# Patient Record
Sex: Male | Born: 1985 | Race: Black or African American | Hispanic: No | Marital: Single | Smoking: Former smoker
Health system: Southern US, Community
[De-identification: ages and names within clinical notes are randomized; demographics above are authoritative.]

## PROBLEM LIST (undated history)

## (undated) DIAGNOSIS — J189 Pneumonia, unspecified organism: Secondary | ICD-10-CM

## (undated) DIAGNOSIS — K469 Unspecified abdominal hernia without obstruction or gangrene: Secondary | ICD-10-CM

## (undated) DIAGNOSIS — C801 Malignant (primary) neoplasm, unspecified: Secondary | ICD-10-CM

## (undated) DIAGNOSIS — N200 Calculus of kidney: Secondary | ICD-10-CM

## (undated) DIAGNOSIS — J45909 Unspecified asthma, uncomplicated: Secondary | ICD-10-CM

## (undated) DIAGNOSIS — N5089 Other specified disorders of the male genital organs: Secondary | ICD-10-CM

## (undated) HISTORY — PX: BRAIN SURGERY: SHX531

## (undated) HISTORY — PX: OTHER SURGICAL HISTORY: SHX169

## (undated) HISTORY — PX: HERNIA REPAIR: SHX51

---
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
## 2004-08-12 ENCOUNTER — Emergency Department (HOSPITAL_COMMUNITY): Admission: EM | Admit: 2004-08-12 | Discharge: 2004-08-13 | Payer: Self-pay | Admitting: Emergency Medicine

## 2005-12-05 ENCOUNTER — Emergency Department (HOSPITAL_COMMUNITY): Admission: EM | Admit: 2005-12-05 | Discharge: 2005-12-05 | Payer: Self-pay | Admitting: Family Medicine

## 2007-11-03 ENCOUNTER — Emergency Department (HOSPITAL_COMMUNITY): Admission: EM | Admit: 2007-11-03 | Discharge: 2007-11-03 | Payer: Self-pay | Admitting: Family Medicine

## 2007-11-05 ENCOUNTER — Emergency Department (HOSPITAL_COMMUNITY): Admission: EM | Admit: 2007-11-05 | Discharge: 2007-11-05 | Payer: Self-pay | Admitting: Family Medicine

## 2008-08-10 ENCOUNTER — Emergency Department (HOSPITAL_COMMUNITY): Admission: EM | Admit: 2008-08-10 | Discharge: 2008-08-10 | Payer: Self-pay | Admitting: Emergency Medicine

## 2009-12-23 ENCOUNTER — Emergency Department (HOSPITAL_COMMUNITY): Admission: EM | Admit: 2009-12-23 | Discharge: 2009-12-23 | Payer: Self-pay | Admitting: Family Medicine

## 2010-07-13 ENCOUNTER — Emergency Department (HOSPITAL_COMMUNITY): Payer: Self-pay

## 2010-07-13 ENCOUNTER — Emergency Department (HOSPITAL_COMMUNITY)
Admission: EM | Admit: 2010-07-13 | Discharge: 2010-07-13 | Disposition: A | Discharge status: Home / Self Care | Payer: Self-pay | Attending: Emergency Medicine | Admitting: Emergency Medicine

## 2010-07-13 DIAGNOSIS — N201 Calculus of ureter: Secondary | ICD-10-CM | POA: Insufficient documentation

## 2010-07-13 DIAGNOSIS — R109 Unspecified abdominal pain: Secondary | ICD-10-CM | POA: Insufficient documentation

## 2010-07-13 LAB — URINALYSIS, ROUTINE W REFLEX MICROSCOPIC
Bilirubin Urine: NEGATIVE
Glucose, UA: NEGATIVE mg/dL
Nitrite: NEGATIVE
Protein, ur: 30 mg/dL — AB
Specific Gravity, Urine: 1.025 (ref 1.005–1.030)
Urobilinogen, UA: 0.2 mg/dL (ref 0.0–1.0)
pH: 7.5 (ref 5.0–8.0)

## 2010-07-13 LAB — URINE MICROSCOPIC-ADD ON

## 2011-01-01 ENCOUNTER — Emergency Department (HOSPITAL_COMMUNITY): Payer: No Typology Code available for payment source

## 2011-01-01 ENCOUNTER — Emergency Department (HOSPITAL_COMMUNITY)
Admission: EM | Admit: 2011-01-01 | Discharge: 2011-01-01 | Disposition: A | Discharge status: Home / Self Care | Payer: No Typology Code available for payment source | Attending: Emergency Medicine | Admitting: Emergency Medicine

## 2011-01-01 DIAGNOSIS — M542 Cervicalgia: Secondary | ICD-10-CM | POA: Insufficient documentation

## 2011-01-01 DIAGNOSIS — M545 Low back pain, unspecified: Secondary | ICD-10-CM | POA: Insufficient documentation

## 2011-01-01 DIAGNOSIS — S139XXA Sprain of joints and ligaments of unspecified parts of neck, initial encounter: Secondary | ICD-10-CM | POA: Insufficient documentation

## 2011-01-01 DIAGNOSIS — S335XXA Sprain of ligaments of lumbar spine, initial encounter: Secondary | ICD-10-CM | POA: Insufficient documentation

## 2011-01-21 LAB — CULTURE, ROUTINE-ABSCESS

## 2012-03-26 DIAGNOSIS — J189 Pneumonia, unspecified organism: Secondary | ICD-10-CM

## 2012-03-26 HISTORY — DX: Pneumonia, unspecified organism: J18.9

## 2012-04-22 ENCOUNTER — Emergency Department (HOSPITAL_COMMUNITY)
Admission: EM | Admit: 2012-04-22 | Discharge: 2012-04-23 | Disposition: A | Discharge status: Home / Self Care | Payer: Self-pay | Attending: Emergency Medicine | Admitting: Emergency Medicine

## 2012-04-22 ENCOUNTER — Encounter (HOSPITAL_COMMUNITY): Payer: Self-pay | Admitting: Emergency Medicine

## 2012-04-22 ENCOUNTER — Emergency Department (HOSPITAL_COMMUNITY): Payer: Self-pay

## 2012-04-22 DIAGNOSIS — R059 Cough, unspecified: Secondary | ICD-10-CM | POA: Insufficient documentation

## 2012-04-22 DIAGNOSIS — R0602 Shortness of breath: Secondary | ICD-10-CM | POA: Insufficient documentation

## 2012-04-22 DIAGNOSIS — R52 Pain, unspecified: Secondary | ICD-10-CM | POA: Insufficient documentation

## 2012-04-22 DIAGNOSIS — R05 Cough: Secondary | ICD-10-CM | POA: Insufficient documentation

## 2012-04-22 DIAGNOSIS — J069 Acute upper respiratory infection, unspecified: Secondary | ICD-10-CM | POA: Insufficient documentation

## 2012-04-22 DIAGNOSIS — Z8719 Personal history of other diseases of the digestive system: Secondary | ICD-10-CM | POA: Insufficient documentation

## 2012-04-22 DIAGNOSIS — R51 Headache: Secondary | ICD-10-CM | POA: Insufficient documentation

## 2012-04-22 DIAGNOSIS — F17200 Nicotine dependence, unspecified, uncomplicated: Secondary | ICD-10-CM | POA: Insufficient documentation

## 2012-04-22 DIAGNOSIS — Z7982 Long term (current) use of aspirin: Secondary | ICD-10-CM | POA: Insufficient documentation

## 2012-04-22 HISTORY — DX: Unspecified asthma, uncomplicated: J45.909

## 2012-04-22 HISTORY — DX: Unspecified abdominal hernia without obstruction or gangrene: K46.9

## 2012-04-22 LAB — POCT I-STAT TROPONIN I: Troponin i, poc: 0 ng/mL (ref 0.00–0.08)

## 2012-04-22 LAB — CBC
HCT: 40.7 % (ref 39.0–52.0)
Hemoglobin: 14.7 g/dL (ref 13.0–17.0)
MCH: 32.2 pg (ref 26.0–34.0)
MCHC: 36.1 g/dL — ABNORMAL HIGH (ref 30.0–36.0)
MCV: 89.3 fL (ref 78.0–100.0)
Platelets: 142 10*3/uL — ABNORMAL LOW (ref 150–400)
RBC: 4.56 MIL/uL (ref 4.22–5.81)
RDW: 12 % (ref 11.5–15.5)
WBC: 9.2 10*3/uL (ref 4.0–10.5)

## 2012-04-22 LAB — BASIC METABOLIC PANEL
BUN: 6 mg/dL (ref 6–23)
CO2: 24 mEq/L (ref 19–32)
Chloride: 97 mEq/L (ref 96–112)
Creatinine, Ser: 1.1 mg/dL (ref 0.50–1.35)
GFR calc Af Amer: >90 mL/min (ref >90)
Glucose, Bld: 103 mg/dL — ABNORMAL HIGH (ref 70–99)
Potassium: 3.4 mEq/L — ABNORMAL LOW (ref 3.5–5.1)

## 2012-04-22 MED ORDER — KETOROLAC TROMETHAMINE 30 MG/ML IJ SOLN
30.0000 mg | Freq: Once | INTRAMUSCULAR | Status: AC
  Administered 2012-04-23: 30 mg via INTRAVENOUS
  Filled 2012-04-22: qty 1

## 2012-04-22 MED ORDER — IBUPROFEN 800 MG PO TABS
ORAL_TABLET | ORAL | Status: AC
  Administered 2012-04-22: 22:00:00
  Filled 2012-04-22: qty 1

## 2012-04-22 MED ORDER — ALBUTEROL SULFATE (5 MG/ML) 0.5% IN NEBU
5.0000 mg | INHALATION_SOLUTION | Freq: Once | RESPIRATORY_TRACT | Status: AC
  Administered 2012-04-22: 5 mg via RESPIRATORY_TRACT
  Filled 2012-04-22: qty 1

## 2012-04-22 MED ORDER — SODIUM CHLORIDE 0.9 % IV BOLUS (SEPSIS)
1000.0000 mL | Freq: Once | INTRAVENOUS | Status: AC
  Administered 2012-04-23: 1000 mL via INTRAVENOUS

## 2012-04-22 MED ORDER — IPRATROPIUM-ALBUTEROL 20-100 MCG/ACT IN AERS
2.0000 | INHALATION_SPRAY | RESPIRATORY_TRACT | Status: DC
  Administered 2012-04-23: 2 via RESPIRATORY_TRACT
  Filled 2012-04-22: qty 4

## 2012-04-22 NOTE — ED Provider Notes (Signed)
History     CSN: [id number]  Arrival date & time 04/22/12  2040   First MD Initiated Contact with Patient 04/22/12 2329      Chief Complaint  Patient presents with  . Fever  . Shortness of Breath  . Headache  . Generalized Body Aches    (Consider location/radiation/quality/duration/timing/severity/associated sxs/prior treatment) HPI History provided by pt.   Pt has had a cough productive of yellow sputum for the past 2 days.  Associated w/ fever, chills, body aches, generalized weakness, constant SOB, frontal headache, nasal congestion, rhinorrhea.  Sx worsened yesterday evening.  Denies CP, abd pain, N/V/D, urinary sx.  Has been taking motrin w/out relief.  No known sick contacts.  Was evaluated at [facility] this morning, diagnosed w/ viral URI and d/c'd home.  Past Medical History  Diagnosis Date  . Hernia     Past Surgical History  Procedure Date  . Hernia repair     History reviewed. No pertinent family history.  History  Substance Use Topics  . Smoking status: Current Every Day Smoker  . Smokeless tobacco: Not on file  . Alcohol Use: Yes     Comment: occ      Review of Systems  All other systems reviewed and are negative.    Allergies  Review of patient's allergies indicates no known allergies.  Home Medications   Current Outpatient Rx  Name  Route  Sig  Dispense  Refill  . ACETAMINOPHEN 500 MG PO TABS   Oral   Take 1,000 mg by mouth every 6 (six) hours as needed. Pain/fever         . ASPIRIN EFFERVESCENT 325 MG PO TBEF   Oral   Take 650 mg by mouth every 6 (six) hours as needed. Pain/fever           BP 128/54  Pulse 96  Temp 102.3 F (39.1 C) (Oral)  Resp 16  SpO2 93%  Physical Exam  Nursing note and vitals reviewed. Constitutional: He is oriented to person, place, and time. He appears well-developed and well-nourished. No distress.  HENT:  Head: Normocephalic and atraumatic.       Mildly erythematous posterior pharynx.   Chapped lips  Eyes:       Normal appearance  Neck: Normal range of motion.  Cardiovascular: Normal rate and regular rhythm.   Pulmonary/Chest: Effort normal and breath sounds normal. No respiratory distress.  Abdominal: Soft. Bowel sounds are normal. He exhibits no distension.       Mild RLQ tenderness that pt attributes to hernia  Musculoskeletal: Normal range of motion.  Lymphadenopathy:    He has no cervical adenopathy.  Neurological: He is alert and oriented to person, place, and time.       CN 3-12 intact.  No sensory deficits.  5/5 and equal upper and lower extremity strength.  No past pointing.   Skin: Skin is warm and dry. No rash noted.  Psychiatric: He has a normal mood and affect. His behavior is normal.    ED Course  Procedures (including critical care time)  Labs Reviewed  BASIC METABOLIC PANEL - Abnormal; Notable for the following:    Sodium 130 (*)     Potassium 3.4 (*)     Glucose, Bld 103 (*)     All other components within normal limits  CBC - Abnormal; Notable for the following:    MCHC 36.1 (*)     Platelets 142 (*)     All other  components within normal limits  POCT I-STAT TROPONIN I   Dg Chest 2 View  04/22/2012  *RADIOLOGY REPORT*  Clinical Data: Shortness of breath, fever and body aches. Headache.  History of smoking and asthma.  CHEST - 2 VIEW  Comparison: Chest radiograph performed 01/01/2011  Findings: The lungs are well-aerated.  There may be minimal left basilar airspace opacity, which could reflect mild pneumonia. There is no evidence of pleural effusion or pneumothorax.  The heart is normal in size; the mediastinal contour is within normal limits.  No acute osseous abnormalities are seen.  IMPRESSION: Suggestion of mild left basilar airspace opacity, which could reflect mild pneumonia.   Original Report Authenticated By: Tonia Ghent, M.D.      1. Viral URI with cough       MDM  Healthy 26yo M presents w/ sx most consistent w/ viral URI.   Febrile, no respiratory distress, mildly dehydrated, no focal neuro deficts on exam. CXR shows possible CAP.  Pt received 1L bolus NS + toradol and motrin.  VS and sx improved.  D/c'd home w/ zpak and albuterol inhaler and recommended rest and fluids.  Return precautions discussed.         Otilio Miu, PA-C 04/23/12 0120

## 2012-04-22 NOTE — ED Notes (Signed)
Patient reports that he has had general body aches and fever for 2 -3 days. The patient reports that he is also having a headache, and shortness of breath. Patient also reports that he is having dizziness and lightheadedness. The patient was seen at [facility] this am

## 2012-04-23 ENCOUNTER — Encounter (HOSPITAL_COMMUNITY): Payer: Self-pay | Admitting: Family Medicine

## 2012-04-23 MED ORDER — AZITHROMYCIN 250 MG PO TABS: ORAL_TABLET | ORAL | Status: DC

## 2012-04-25 NOTE — ED Provider Notes (Signed)
Medical screening examination/treatment/procedure(s) were performed by non-physician practitioner and as supervising physician I was immediately available for consultation/collaboration.   Flint Melter, MD 04/25/12 [phone]

## 2012-05-30 ENCOUNTER — Encounter (HOSPITAL_BASED_OUTPATIENT_CLINIC_OR_DEPARTMENT_OTHER): Payer: Self-pay | Admitting: *Deleted

## 2012-05-30 ENCOUNTER — Emergency Department (HOSPITAL_BASED_OUTPATIENT_CLINIC_OR_DEPARTMENT_OTHER)
Admission: EM | Admit: 2012-05-30 | Discharge: 2012-05-30 | Disposition: A | Discharge status: Home / Self Care | Payer: Self-pay | Attending: Emergency Medicine | Admitting: Emergency Medicine

## 2012-05-30 ENCOUNTER — Emergency Department (HOSPITAL_BASED_OUTPATIENT_CLINIC_OR_DEPARTMENT_OTHER): Payer: Self-pay

## 2012-05-30 DIAGNOSIS — F1210 Cannabis abuse, uncomplicated: Secondary | ICD-10-CM | POA: Insufficient documentation

## 2012-05-30 DIAGNOSIS — R599 Enlarged lymph nodes, unspecified: Secondary | ICD-10-CM | POA: Insufficient documentation

## 2012-05-30 DIAGNOSIS — Z8619 Personal history of other infectious and parasitic diseases: Secondary | ICD-10-CM | POA: Insufficient documentation

## 2012-05-30 DIAGNOSIS — J45909 Unspecified asthma, uncomplicated: Secondary | ICD-10-CM | POA: Insufficient documentation

## 2012-05-30 DIAGNOSIS — M542 Cervicalgia: Secondary | ICD-10-CM | POA: Insufficient documentation

## 2012-05-30 DIAGNOSIS — Z8719 Personal history of other diseases of the digestive system: Secondary | ICD-10-CM | POA: Insufficient documentation

## 2012-05-30 DIAGNOSIS — Z8701 Personal history of pneumonia (recurrent): Secondary | ICD-10-CM | POA: Insufficient documentation

## 2012-05-30 DIAGNOSIS — H9209 Otalgia, unspecified ear: Secondary | ICD-10-CM | POA: Insufficient documentation

## 2012-05-30 DIAGNOSIS — Z87442 Personal history of urinary calculi: Secondary | ICD-10-CM | POA: Insufficient documentation

## 2012-05-30 DIAGNOSIS — R509 Fever, unspecified: Secondary | ICD-10-CM | POA: Insufficient documentation

## 2012-05-30 DIAGNOSIS — R5381 Other malaise: Secondary | ICD-10-CM | POA: Insufficient documentation

## 2012-05-30 DIAGNOSIS — J039 Acute tonsillitis, unspecified: Secondary | ICD-10-CM

## 2012-05-30 DIAGNOSIS — R52 Pain, unspecified: Secondary | ICD-10-CM | POA: Insufficient documentation

## 2012-05-30 DIAGNOSIS — F17200 Nicotine dependence, unspecified, uncomplicated: Secondary | ICD-10-CM | POA: Insufficient documentation

## 2012-05-30 HISTORY — DX: Pneumonia, unspecified organism: J18.9

## 2012-05-30 HISTORY — DX: Calculus of kidney: N20.0

## 2012-05-30 LAB — CBC WITH DIFFERENTIAL/PLATELET
Eosinophils Relative: 0 % (ref 0–5)
Hemoglobin: 15.8 g/dL (ref 13.0–17.0)
Lymphocytes Relative: 13 % (ref 12–46)
Lymphs Abs: 1.4 10*3/uL (ref 0.7–4.0)
MCV: 89.9 fL (ref 78.0–100.0)
Monocytes Relative: 13 % — ABNORMAL HIGH (ref 3–12)
Platelets: 167 10*3/uL (ref 150–400)
RBC: 4.86 MIL/uL (ref 4.22–5.81)
WBC: 10.7 10*3/uL — ABNORMAL HIGH (ref 4.0–10.5)

## 2012-05-30 MED ORDER — HYDROCODONE-ACETAMINOPHEN 5-325 MG PO TABS: 2.0000 | ORAL_TABLET | ORAL | Status: DC | PRN

## 2012-05-30 MED ORDER — CLINDAMYCIN PHOSPHATE 600 MG/50ML IV SOLN
600.0000 mg | Freq: Once | INTRAVENOUS | Status: AC
  Administered 2012-05-30: 600 mg via INTRAVENOUS
  Filled 2012-05-30: qty 50

## 2012-05-30 MED ORDER — DEXTROSE 5 % IV SOLN
1.0000 g | INTRAVENOUS | Status: DC
  Administered 2012-05-30: 1 g via INTRAVENOUS
  Filled 2012-05-30: qty 10

## 2012-05-30 MED ORDER — IBUPROFEN 800 MG PO TABS
800.0000 mg | ORAL_TABLET | Freq: Once | ORAL | Status: AC
  Administered 2012-05-30: 800 mg via ORAL
  Filled 2012-05-30: qty 1

## 2012-05-30 MED ORDER — CLINDAMYCIN HCL 150 MG PO CAPS: 150.0000 mg | ORAL_CAPSULE | Freq: Four times a day (QID) | ORAL | Status: DC

## 2012-05-30 NOTE — ED Notes (Signed)
Patient states he has had generalized body aches, headache, sore throat w/ swallowing problems and ear aches, chills, sweats and fever.

## 2012-05-30 NOTE — ED Provider Notes (Signed)
History     CSN: [id number]  Arrival date & time 05/30/12  1528   First MD Initiated Contact with Patient 05/30/12 1558      Chief Complaint  Patient presents with  . Generalized Body Aches  . Sore Throat    (Consider location/radiation/quality/duration/timing/severity/associated sxs/prior treatment) Patient is a 27 y.o. male presenting with pharyngitis. The history is provided by the patient. No language interpreter was used.  Sore Throat This is a new problem. The current episode started yesterday. The problem occurs constantly. The problem has been gradually worsening. Associated symptoms include fatigue, a fever, a sore throat and swollen glands. Nothing aggravates the symptoms. He has tried nothing for the symptoms. The treatment provided no relief.   Pt complains of a bad sore throat.   Pt has pain into his ears and his neck.   Pt reports he had influenza and pneumonia last month.  Pt complains of fevers and sweating Past Medical History  Diagnosis Date  . Hernia   . Asthma   . Flu 12/14  . Pneumonia 12/13  . Kidney stones     Past Surgical History  Procedure Date  . Hernia repair     No family history on file.  History  Substance Use Topics  . Smoking status: Current Every Day Smoker  . Smokeless tobacco: Not on file  . Alcohol Use: Yes     Comment: occ      Review of Systems  Constitutional: Positive for fever and fatigue.  HENT: Positive for sore throat.   All other systems reviewed and are negative.    Allergies  Review of patient's allergies indicates no known allergies.  Home Medications   Current Outpatient Rx  Name  Route  Sig  Dispense  Refill  . IBUPROFEN 800 MG PO TABS   Oral   Take 800 mg by mouth every 8 (eight) hours as needed.         Marland Kitchen PHENOL 1.4 % MT LIQD   Mouth/Throat   Use as directed 1 spray in the mouth or throat as needed.         . ACETAMINOPHEN 500 MG PO TABS   Oral   Take 1,000 mg by mouth every 6 (six) hours as  needed. Pain/fever         . ASPIRIN EFFERVESCENT 325 MG PO TBEF   Oral   Take 650 mg by mouth every 6 (six) hours as needed. Pain/fever         . AZITHROMYCIN 250 MG PO TABS      2po day 1 and then 1po days 2-5   6 each   0     There were no vitals taken for this visit.  Physical Exam  Nursing note and vitals reviewed. Constitutional: He appears well-developed and well-nourished.  HENT:  Head: Normocephalic and atraumatic.  Mouth/Throat: Oropharyngeal exudate present.       Exudative, erythematous edematous tonsils,   Uvula midline, no palate edema  Eyes: Conjunctivae normal and EOM are normal. Pupils are equal, round, and reactive to light.  Neck: Normal range of motion. Neck supple.  Cardiovascular: Normal rate.   Pulmonary/Chest: Effort normal.  Abdominal: Soft.  Musculoskeletal: Normal range of motion.  Neurological: He is alert.  Skin: Skin is warm.    ED Course  Procedures (including critical care time)   Labs Reviewed  RAPID STREP SCREEN   No results found.   No diagnosis found.    MDM  Strep and  mono are negative.    Pt given clindamycin and rocephin IV.    Pt to return here tomorrow at 12 noon for recheck.          Lonia Skinner [place], [place] 05/30/12 [phone]

## 2012-05-30 NOTE — ED Provider Notes (Addendum)
Medical screening examination/treatment/procedure(s) were performed by non-physician practitioner and as supervising physician I was immediately available for consultation/collaboration.    Celene Kras, MD 05/30/12 8657  Celene Kras, MD 05/30/12 [phone]

## 2012-12-11 ENCOUNTER — Emergency Department (HOSPITAL_BASED_OUTPATIENT_CLINIC_OR_DEPARTMENT_OTHER): Payer: Self-pay

## 2012-12-11 ENCOUNTER — Encounter (HOSPITAL_BASED_OUTPATIENT_CLINIC_OR_DEPARTMENT_OTHER): Payer: Self-pay | Admitting: *Deleted

## 2012-12-11 ENCOUNTER — Emergency Department (HOSPITAL_BASED_OUTPATIENT_CLINIC_OR_DEPARTMENT_OTHER)
Admission: EM | Admit: 2012-12-11 | Discharge: 2012-12-11 | Disposition: A | Discharge status: Home / Self Care | Payer: Self-pay | Attending: Emergency Medicine | Admitting: Emergency Medicine

## 2012-12-11 DIAGNOSIS — S335XXA Sprain of ligaments of lumbar spine, initial encounter: Secondary | ICD-10-CM | POA: Insufficient documentation

## 2012-12-11 DIAGNOSIS — R059 Cough, unspecified: Secondary | ICD-10-CM | POA: Insufficient documentation

## 2012-12-11 DIAGNOSIS — S39012A Strain of muscle, fascia and tendon of lower back, initial encounter: Secondary | ICD-10-CM

## 2012-12-11 DIAGNOSIS — Y92410 Unspecified street and highway as the place of occurrence of the external cause: Secondary | ICD-10-CM | POA: Insufficient documentation

## 2012-12-11 DIAGNOSIS — J3489 Other specified disorders of nose and nasal sinuses: Secondary | ICD-10-CM | POA: Insufficient documentation

## 2012-12-11 DIAGNOSIS — R05 Cough: Secondary | ICD-10-CM | POA: Insufficient documentation

## 2012-12-11 DIAGNOSIS — Z87442 Personal history of urinary calculi: Secondary | ICD-10-CM | POA: Insufficient documentation

## 2012-12-11 DIAGNOSIS — Z8719 Personal history of other diseases of the digestive system: Secondary | ICD-10-CM | POA: Insufficient documentation

## 2012-12-11 DIAGNOSIS — J40 Bronchitis, not specified as acute or chronic: Secondary | ICD-10-CM

## 2012-12-11 DIAGNOSIS — J45901 Unspecified asthma with (acute) exacerbation: Secondary | ICD-10-CM | POA: Insufficient documentation

## 2012-12-11 DIAGNOSIS — F17200 Nicotine dependence, unspecified, uncomplicated: Secondary | ICD-10-CM | POA: Insufficient documentation

## 2012-12-11 DIAGNOSIS — Z8701 Personal history of pneumonia (recurrent): Secondary | ICD-10-CM | POA: Insufficient documentation

## 2012-12-11 DIAGNOSIS — Y939 Activity, unspecified: Secondary | ICD-10-CM | POA: Insufficient documentation

## 2012-12-11 MED ORDER — ALBUTEROL SULFATE HFA 108 (90 BASE) MCG/ACT IN AERS
2.0000 | INHALATION_SPRAY | RESPIRATORY_TRACT | Status: DC | PRN
  Administered 2012-12-11: 2 via RESPIRATORY_TRACT
  Filled 2012-12-11: qty 6.7

## 2012-12-11 MED ORDER — HYDROCODONE-ACETAMINOPHEN 5-325 MG PO TABS
ORAL_TABLET | ORAL | Status: AC
  Filled 2012-12-11: qty 1

## 2012-12-11 MED ORDER — PREDNISONE 20 MG PO TABS: 40.0000 mg | ORAL_TABLET | Freq: Every day | ORAL | Status: DC

## 2012-12-11 MED ORDER — IPRATROPIUM BROMIDE 0.02 % IN SOLN
0.5000 mg | Freq: Once | RESPIRATORY_TRACT | Status: AC
  Administered 2012-12-11: 0.5 mg via RESPIRATORY_TRACT
  Filled 2012-12-11: qty 2.5

## 2012-12-11 MED ORDER — AZITHROMYCIN 250 MG PO TABS: 250.0000 mg | ORAL_TABLET | Freq: Every day | ORAL | Status: DC

## 2012-12-11 MED ORDER — PREDNISONE 50 MG PO TABS
60.0000 mg | ORAL_TABLET | Freq: Once | ORAL | Status: AC
  Administered 2012-12-11: 60 mg via ORAL
  Filled 2012-12-11: qty 1

## 2012-12-11 MED ORDER — HYDROCODONE-ACETAMINOPHEN 5-325 MG PO TABS
1.0000 | ORAL_TABLET | Freq: Once | ORAL | Status: AC
  Administered 2012-12-11: 1 via ORAL

## 2012-12-11 MED ORDER — ALBUTEROL SULFATE (5 MG/ML) 0.5% IN NEBU
5.0000 mg | INHALATION_SOLUTION | Freq: Once | RESPIRATORY_TRACT | Status: AC
  Administered 2012-12-11: 5 mg via RESPIRATORY_TRACT
  Filled 2012-12-11: qty 1

## 2012-12-11 MED ORDER — HYDROCODONE-ACETAMINOPHEN 5-325 MG PO TABS: 1.0000 | ORAL_TABLET | ORAL | Status: DC | PRN

## 2012-12-11 NOTE — Progress Notes (Signed)
Verified Patients identity by patient stating his full name, birthday, and age.
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
## 2012-12-12 NOTE — ED Provider Notes (Signed)
Medical screening examination/treatment/procedure(s) were performed by non-physician practitioner and as supervising physician I was immediately available for consultation/collaboration.   Shanna Cisco, MD 12/12/12 [phone]

## 2012-12-25 ENCOUNTER — Emergency Department (HOSPITAL_BASED_OUTPATIENT_CLINIC_OR_DEPARTMENT_OTHER)
Admission: EM | Admit: 2012-12-25 | Discharge: 2012-12-25 | Disposition: A | Discharge status: Home / Self Care | Payer: Self-pay | Attending: Emergency Medicine | Admitting: Emergency Medicine

## 2012-12-25 ENCOUNTER — Emergency Department (HOSPITAL_BASED_OUTPATIENT_CLINIC_OR_DEPARTMENT_OTHER): Payer: Self-pay

## 2012-12-25 ENCOUNTER — Encounter (HOSPITAL_BASED_OUTPATIENT_CLINIC_OR_DEPARTMENT_OTHER): Payer: Self-pay | Admitting: *Deleted

## 2012-12-25 DIAGNOSIS — K612 Anorectal abscess: Secondary | ICD-10-CM

## 2012-12-25 DIAGNOSIS — F17200 Nicotine dependence, unspecified, uncomplicated: Secondary | ICD-10-CM | POA: Insufficient documentation

## 2012-12-25 DIAGNOSIS — K610 Anal abscess: Secondary | ICD-10-CM

## 2012-12-25 MED ORDER — ONDANSETRON HCL 4 MG/2ML IJ SOLN
4.0000 mg | Freq: Once | INTRAMUSCULAR | Status: AC
  Administered 2012-12-25: 4 mg via INTRAVENOUS
  Filled 2012-12-25: qty 2

## 2012-12-25 MED ORDER — LIDOCAINE 4 % EX CREA
TOPICAL_CREAM | Freq: Once | CUTANEOUS | Status: DC
  Filled 2012-12-25: qty 5

## 2012-12-25 MED ORDER — LIDOCAINE 5 % EX OINT
TOPICAL_OINTMENT | Freq: Every day | CUTANEOUS | Status: AC | PRN
  Administered 2012-12-25: 23:00:00 via TOPICAL
  Filled 2012-12-25: qty 35.44

## 2012-12-25 MED ORDER — HYDROMORPHONE HCL PF 1 MG/ML IJ SOLN
1.0000 mg | Freq: Once | INTRAMUSCULAR | Status: AC
  Administered 2012-12-25: 1 mg via INTRAVENOUS
  Filled 2012-12-25: qty 1

## 2012-12-25 MED ORDER — IOHEXOL 300 MG/ML  SOLN
50.0000 mL | Freq: Once | INTRAMUSCULAR | Status: AC | PRN
  Administered 2012-12-25: 50 mL via ORAL

## 2012-12-25 MED ORDER — HYDROMORPHONE HCL PF 1 MG/ML IJ SOLN
INTRAMUSCULAR | Status: AC
  Filled 2012-12-25: qty 1

## 2012-12-25 MED ORDER — HYDROMORPHONE HCL PF 1 MG/ML IJ SOLN
1.0000 mg | Freq: Once | INTRAMUSCULAR | Status: AC
  Administered 2012-12-25: 1 mg via INTRAVENOUS

## 2012-12-25 MED ORDER — IOHEXOL 300 MG/ML  SOLN
100.0000 mL | Freq: Once | INTRAMUSCULAR | Status: AC | PRN
  Administered 2012-12-25: 100 mL via INTRAVENOUS

## 2012-12-25 MED ORDER — LIDOCAINE-EPINEPHRINE 2 %-1:100000 IJ SOLN: 20.0000 mL | Freq: Once | INTRAMUSCULAR | Status: DC

## 2012-12-25 MED ORDER — HYDROMORPHONE HCL PF 1 MG/ML IJ SOLN: 1.0000 mg | Freq: Once | INTRAMUSCULAR | Status: DC

## 2012-12-25 NOTE — ED Notes (Signed)
Explained to Pt. And family that he will be transported by [facility].  Pt. Noted with attitude discomfort due to having to wait on transport service and because he is hungry.

## 2012-12-25 NOTE — ED Notes (Signed)
Transported to [facility] by [facility]

## 2012-12-25 NOTE — ED Notes (Signed)
abscess x 2 to his buttocks. Hx of same a year ago.

## 2012-12-25 NOTE — ED Provider Notes (Signed)
Pt seen and examined - has perirectal abscess - CT confirm 3cmin size - close to sphincter - d/w Gen Surgery who will see in ED to drain - Dr. Maisie Fus accepted.  D/w Dr. Silverio Lay - accepted to ED  Vida Roller, MD 12/25/12 [phone]

## 2012-12-25 NOTE — ED Provider Notes (Signed)
Medical screening examination/treatment/procedure(s) were performed by non-physician practitioner and as supervising physician I was immediately available for consultation/collaboration.   Nusrat Encarnacion E Juelz Claar, MD 12/25/12 1656 

## 2012-12-25 NOTE — ED Provider Notes (Signed)
Medical screening examination/treatment/procedure(s) were conducted as a shared visit with non-physician practitioner(s) and myself.  I personally evaluated the patient during the encounter  Please see my separate respective documentation pertaining to this patient encounter   Vida Roller, MD 12/25/12 2245

## 2012-12-25 NOTE — ED Provider Notes (Signed)
Patient to the ED transferred from [facility] to drain a perirectal abscess.  Dr. Maisie Fus has informed me that she has completed draining the rectal abscess and that the patient can go home with instructions to use a sitz bath 6 times a day. Does not need antibiotics and is ready for discharge.  Dr. Wilkie Aye is aware that the patient is being put up for discharge.  Dx: Perirectal abscess  27 y.o.Airen A Daise's evaluation in the Emergency Department is complete. It has been determined that no acute conditions requiring further emergency intervention are present at this time. The patient/guardian have been advised of the diagnosis and plan. We have discussed signs and symptoms that warrant return to the ED, such as changes or worsening in symptoms.  Vital signs are stable at discharge. Filed Vitals:   12/25/12 2119  BP: 123/71  Pulse: 73  Temp:   Resp: 18    Patient/guardian has voiced understanding and agreed to follow-up with the PCP or specialist.   Dorthula Matas, PA-C 12/25/12 2341

## 2012-12-25 NOTE — Consult Note (Signed)
Reason for Consult:Abscess, perirectal Referring Physician: Dr Monia Sabal is an 27 y.o. male.  HPI: He has been having perirectal pain for approximately 3-4 days. This has been getting worse. He denies any fevers. He denies any drainage or bleeding. He has had abscess like this before approximately 2 years ago and approximately the same spot. He denies any history of Crohn's disease within his family.  Past Medical History  Diagnosis Date  . Hernia   . Asthma   . Flu 12/14  . Pneumonia 12/13  . Kidney stones     Past Surgical History  Procedure Laterality Date  . Hernia repair      No family history on file.  Social History:  reports that he has been smoking Cigarettes.  He has been smoking about 0.50 packs per day. He does not have any smokeless tobacco history on file. He reports that  drinks alcohol. He reports that he uses illicit drugs (Marijuana).  Allergies: No Known Allergies  Medications: I have reviewed the patient's current medications.  No results found for this or any previous visit (from the past 48 hour(s)).  Ct Pelvis W Contrast  12/25/2012   CLINICAL DATA:  Rectal abscess. Severe pain.  EXAM: CT PELVIS WITH CONTRAST  TECHNIQUE: Multidetector CT imaging of the pelvis was performed using the standard protocol following the bolus administration of intravenous contrast.  CONTRAST:  50mL OMNIPAQUE IOHEXOL 300 MG/ML SOLN, OMNIPAQUE IOHEXOL 300 MG/ML SOLN  COMPARISON:  07/12/2012  FINDINGS: There is a 2.9 cm fluid collection just to the left of midline near the lower rectum or anus compatible with small abscess. No other fluid collection. Visualized large and small bowel are otherwise unremarkable. Urinary bladder and prostate grossly unremarkable. No free fluid, free air or adenopathy. No acute bony abnormality.  IMPRESSION: 2.9 cm left perirectal or perianal abscess.   Electronically Signed   By: Charlett Nose   On: 12/25/2012 17:41    Review of  Systems  Constitutional: Negative for fever and chills.  Respiratory: Negative for cough.   Cardiovascular: Negative for chest pain.  Gastrointestinal: Negative for nausea, vomiting and abdominal pain.  Genitourinary: Negative for dysuria.  Skin: Negative for rash.  Neurological: Negative for dizziness and headaches.   Blood pressure 123/71, pulse 73, temperature 98.2 F (36.8 C), temperature source Oral, resp. rate 18, height 6\' 2"  (1.88 m), weight 220 lb (99.791 kg), SpO2 98.00%. Physical Exam  Constitutional: He is oriented to person, place, and time. He appears well-developed and well-nourished.  HENT:  Head: Normocephalic and atraumatic.  Eyes: Conjunctivae are normal. Pupils are equal, round, and reactive to light.  Neck: Normal range of motion. Neck supple.  Cardiovascular: Normal rate and regular rhythm.   Respiratory: Effort normal and breath sounds normal. No respiratory distress.  GI: Soft. Bowel sounds are normal.  L anterior abscess, perirectal  Musculoskeletal: Normal range of motion.  Neurological: He is alert and oriented to person, place, and time.  Skin: Skin is warm and dry.    Assessment/Plan: The patient has a perirectal abscess. This can be debrided at bedside. We discussed the risk and benefits of this procedure. Consent was verbally obtained.  Given that this is a recurrent abscess I have asked him to see me back in the office in 3 to four-weeks.  I have asked him to do sitz baths at home 3-4 times a day. He should take a stool softener twice daily. We have given him some lidocaine  cream to use for local pain relief.  Patient stable for discharge.  Procedure: I&D perirectal abscess Surgeon: Maisie Fus Anesthesia: Local 2% lidocaine with epinephrine Indication: Perirectal abscess, confirmed by CT scan Description: The patient was identified as well as the site of the abscess. The area around the abscess was infused with lidocaine with epinephrine after being  swabbed with Betadine. An incision was made with an 11 blade scalpel in a cruciate manner. A large amount of purulence was extruded. A sterile dressing was then applied. The patient tolerated procedure well.  Mona Ayars C. 12/25/2012, 11:44 PM

## 2012-12-25 NOTE — ED Notes (Signed)
Patient repeatedly asking for food and drink. Patient and family at bedside has been informed that patient can not have anything to eat or drink until cleared by the surgeon. Rationale explained that if patient was to have to go to surgery tonight having food or drink in his system increases his risks of complication. Patient continues to ask new staff members as they enter room for food or beverage.

## 2012-12-25 NOTE — ED Notes (Signed)
Bed: WA01 Expected date:  Expected time:  Means of arrival:  Comments: Hold for transfer from [facility]

## 2012-12-25 NOTE — ED Notes (Signed)
Checking orders and vitals

## 2012-12-25 NOTE — ED Provider Notes (Signed)
CSN: [id number]     Arrival date & time 12/25/12  1355 History   First MD Initiated Contact with Patient 12/25/12 1412     Chief Complaint  Patient presents with  . Abscess   (Consider location/radiation/quality/duration/timing/severity/associated sxs/prior Treatment) HPI Comments: Pt states that he has similar history and he was supposed to have surgery,but it was never scheduled  Patient is a 27 y.o. male presenting with abscess. The history is provided by the patient. No language interpreter was used.  Abscess Location:  Ano-genital Ano-genital abscess location:  Anus Abscess quality: fluctuance and painful   Abscess quality: no redness   Red streaking: no   Duration:  2 days Progression:  Worsening Pain details:    Quality:  Throbbing   Severity:  Severe   Past Medical History  Diagnosis Date  . Hernia   . Asthma   . Flu 12/14  . Pneumonia 12/13  . Kidney stones    Past Surgical History  Procedure Laterality Date  . Hernia repair     No family history on file. History  Substance Use Topics  . Smoking status: Current Every Day Smoker -- 0.50 packs/day    Types: Cigarettes  . Smokeless tobacco: Not on file  . Alcohol Use: Yes     Comment: occ    Review of Systems  Constitutional: Negative.   Respiratory: Negative.   Cardiovascular: Negative.     Allergies  Review of patient's allergies indicates no known allergies.  Home Medications   Current Outpatient Rx  Name  Route  Sig  Dispense  Refill  . azithromycin (ZITHROMAX) 250 MG tablet   Oral   Take 1 tablet (250 mg total) by mouth daily. Take first 2 tablets together, then 1 every day until finished.   6 tablet   0   . HYDROcodone-acetaminophen (NORCO/VICODIN) 5-325 MG per tablet   Oral   Take 1 tablet by mouth every 4 (four) hours as needed for pain.   10 tablet   0   . predniSONE (DELTASONE) 20 MG tablet   Oral   Take 2 tablets (40 mg total) by mouth daily.   10 tablet   0    BP 124/79   Pulse 90  Temp(Src) 98.2 F (36.8 C) (Oral)  Resp 20  Ht 6\' 2"  (1.88 m)  Wt 220 lb (99.791 kg)  BMI 28.23 kg/m2  SpO2 99% Physical Exam  Nursing note and vitals reviewed. Constitutional: He is oriented to person, place, and time. He appears well-developed and well-nourished.  Cardiovascular: Normal rate and regular rhythm.   Pulmonary/Chest: Effort normal and breath sounds normal.  Abdominal: Soft. Bowel sounds are normal. There is no tenderness.  Genitourinary:  Fluctuance noted to the rectal area:unable to access whether it tracts in the bottom as pt not tolerating exam well  Musculoskeletal: Normal range of motion.  Neurological: He is alert and oriented to person, place, and time.  Skin: Skin is warm and dry.    ED Course  Procedures (including critical care time) Labs Review Labs Reviewed - No data to display Imaging Review No results found.  MDM  No diagnosis found. Pt is waiting on ct:pt is being left with [facility]    Teressa Lower, NP 12/25/12 1625

## 2012-12-25 NOTE — ED Notes (Signed)
Dr Thomas at bedside

## 2012-12-25 NOTE — ED Provider Notes (Signed)
CSN: [id number]     Arrival date & time 12/25/12  1355 History   First MD Initiated Contact with Patient 12/25/12 1412     Chief Complaint  Patient presents with  . Abscess   (Consider location/radiation/quality/duration/timing/severity/associated sxs/prior Treatment) HPI  Past Medical History  Diagnosis Date  . Hernia   . Asthma   . Flu 12/14  . Pneumonia 12/13  . Kidney stones    Past Surgical History  Procedure Laterality Date  . Hernia repair     No family history on file. History  Substance Use Topics  . Smoking status: Current Every Day Smoker -- 0.50 packs/day    Types: Cigarettes  . Smokeless tobacco: Not on file  . Alcohol Use: Yes     Comment: occ    Review of Systems  Allergies  Review of patient's allergies indicates no known allergies.  Home Medications   Current Outpatient Rx  Name  Route  Sig  Dispense  Refill  . azithromycin (ZITHROMAX) 250 MG tablet   Oral   Take 1 tablet (250 mg total) by mouth daily. Take first 2 tablets together, then 1 every day until finished.   6 tablet   0   . HYDROcodone-acetaminophen (NORCO/VICODIN) 5-325 MG per tablet   Oral   Take 1 tablet by mouth every 4 (four) hours as needed for pain.   10 tablet   0   . predniSONE (DELTASONE) 20 MG tablet   Oral   Take 2 tablets (40 mg total) by mouth daily.   10 tablet   0    BP 133/68  Pulse 84  Temp(Src) 98.2 F (36.8 C) (Oral)  Resp 18  Ht 6\' 2"  (1.88 m)  Wt 220 lb (99.791 kg)  BMI 28.23 kg/m2  SpO2 99% Physical Exam  ED Course  Procedures (including critical care time) Labs Review Labs Reviewed - No data to display Imaging Review Ct Pelvis W Contrast  12/25/2012   CLINICAL DATA:  Rectal abscess. Severe pain.  EXAM: CT PELVIS WITH CONTRAST  TECHNIQUE: Multidetector CT imaging of the pelvis was performed using the standard protocol following the bolus administration of intravenous contrast.  CONTRAST:  50mL OMNIPAQUE IOHEXOL 300 MG/ML SOLN, OMNIPAQUE  IOHEXOL 300 MG/ML SOLN  COMPARISON:  07/12/2012  FINDINGS: There is a 2.9 cm fluid collection just to the left of midline near the lower rectum or anus compatible with small abscess. No other fluid collection. Visualized large and small bowel are otherwise unremarkable. Urinary bladder and prostate grossly unremarkable. No free fluid, free air or adenopathy. No acute bony abnormality.  IMPRESSION: 2.9 cm left perirectal or perianal abscess.   Electronically Signed   By: Charlett Nose   On: 12/25/2012 17:41    MDM   1. Perianal abscess   Patient care taken over from Lonna Cobb FNP - patient with perianal vs perirectal abscess - has been accepted in transfer to [facility] to the care of Dr. Maisie Fus - patient medicatied     Izola Price. Marisue Humble, [place] 12/25/12 1936

## 2012-12-25 NOTE — ED Notes (Signed)
Spoke to Dr Maisie Fus, requested lidocaine creme to be applied to patient abcsess site.

## 2012-12-25 NOTE — ED Notes (Signed)
Spoke to pharmacy re: delay in medications. Orders corrected based on availability. Medications to be sent at this time.

## 2012-12-26 NOTE — ED Provider Notes (Signed)
Medical screening examination/treatment/procedure(s) were performed by non-physician practitioner and as supervising physician I was immediately available for consultation/collaboration.  Shon Baton, MD 12/26/12 [phone]

## 2013-12-11 ENCOUNTER — Emergency Department (HOSPITAL_COMMUNITY)
Admission: EM | Admit: 2013-12-11 | Discharge: 2013-12-11 | Disposition: A | Discharge status: Home / Self Care | Payer: Self-pay | Attending: Emergency Medicine | Admitting: Emergency Medicine

## 2013-12-11 ENCOUNTER — Encounter (HOSPITAL_COMMUNITY): Payer: Self-pay | Admitting: Emergency Medicine

## 2013-12-11 ENCOUNTER — Emergency Department (HOSPITAL_COMMUNITY): Payer: Self-pay

## 2013-12-11 DIAGNOSIS — Z8701 Personal history of pneumonia (recurrent): Secondary | ICD-10-CM | POA: Insufficient documentation

## 2013-12-11 DIAGNOSIS — F17200 Nicotine dependence, unspecified, uncomplicated: Secondary | ICD-10-CM | POA: Insufficient documentation

## 2013-12-11 DIAGNOSIS — K610 Anal abscess: Secondary | ICD-10-CM

## 2013-12-11 DIAGNOSIS — Z79899 Other long term (current) drug therapy: Secondary | ICD-10-CM | POA: Insufficient documentation

## 2013-12-11 DIAGNOSIS — K612 Anorectal abscess: Secondary | ICD-10-CM | POA: Insufficient documentation

## 2013-12-11 DIAGNOSIS — J45909 Unspecified asthma, uncomplicated: Secondary | ICD-10-CM | POA: Insufficient documentation

## 2013-12-11 LAB — CBC
HEMATOCRIT: 43.4 % (ref 39.0–52.0)
Hemoglobin: 15.4 g/dL (ref 13.0–17.0)
MCH: 33.1 pg (ref 26.0–34.0)
MCHC: 35.5 g/dL (ref 30.0–36.0)
MCV: 93.3 fL (ref 78.0–100.0)
Platelets: 269 10*3/uL (ref 150–400)
RBC: 4.65 MIL/uL (ref 4.22–5.81)
RDW: 11.8 % (ref 11.5–15.5)
WBC: 11.5 10*3/uL — AB (ref 4.0–10.5)

## 2013-12-11 LAB — BASIC METABOLIC PANEL
Anion gap: 11 (ref 5–15)
BUN: 8 mg/dL (ref 6–23)
CALCIUM: 9 mg/dL (ref 8.4–10.5)
CHLORIDE: 100 meq/L (ref 96–112)
CO2: 26 mEq/L (ref 19–32)
CREATININE: 1.12 mg/dL (ref 0.50–1.35)
GFR calc non Af Amer: 87 mL/min — ABNORMAL LOW (ref >90)
Glucose, Bld: 99 mg/dL (ref 70–99)
Potassium: 4.2 mEq/L (ref 3.7–5.3)
Sodium: 137 mEq/L (ref 137–147)

## 2013-12-11 MED ORDER — IOHEXOL 300 MG/ML  SOLN
100.0000 mL | Freq: Once | INTRAMUSCULAR | Status: AC | PRN
  Administered 2013-12-11: 100 mL via INTRAVENOUS

## 2013-12-11 MED ORDER — OXYCODONE-ACETAMINOPHEN 5-325 MG PO TABS
1.0000 | ORAL_TABLET | Freq: Once | ORAL | Status: AC
  Administered 2013-12-11: 1 via ORAL
  Filled 2013-12-11: qty 1

## 2013-12-11 MED ORDER — MIDAZOLAM HCL 2 MG/2ML IJ SOLN
INTRAMUSCULAR | Status: AC
  Filled 2013-12-11: qty 2

## 2013-12-11 MED ORDER — BISACODYL 5 MG PO TBEC: 5.0000 mg | DELAYED_RELEASE_TABLET | Freq: Every day | ORAL | Status: DC | PRN

## 2013-12-11 MED ORDER — HYDROMORPHONE HCL PF 1 MG/ML IJ SOLN
1.0000 mg | Freq: Once | INTRAMUSCULAR | Status: AC
  Administered 2013-12-11: 1 mg via INTRAVENOUS
  Filled 2013-12-11: qty 1

## 2013-12-11 MED ORDER — OXYCODONE-ACETAMINOPHEN 5-325 MG PO TABS: 2.0000 | ORAL_TABLET | ORAL | Status: DC | PRN

## 2013-12-11 MED ORDER — LIDOCAINE-EPINEPHRINE (PF) 1 %-1:200000 IJ SOLN
INTRAMUSCULAR | Status: AC
  Administered 2013-12-11: 30 mL
  Filled 2013-12-11: qty 10

## 2013-12-11 MED ORDER — MIDAZOLAM HCL 2 MG/2ML IJ SOLN
2.0000 mg | Freq: Once | INTRAMUSCULAR | Status: AC
  Administered 2013-12-11: 2 mg via INTRAVENOUS

## 2013-12-11 MED ORDER — SULFAMETHOXAZOLE-TRIMETHOPRIM 800-160 MG PO TABS: 1.0000 | ORAL_TABLET | Freq: Two times a day (BID) | ORAL | Status: DC

## 2013-12-11 MED ORDER — LIDOCAINE-EPINEPHRINE 2 %-1:100000 IJ SOLN
INTRAMUSCULAR | Status: AC
  Filled 2013-12-11: qty 1

## 2013-12-11 NOTE — ED Notes (Signed)
Pt hs 2 abscess in crack of buttocks. Pt states had this about 3 months it was lanced pt was referred to surgeon but was told he didn't need it removed.

## 2013-12-11 NOTE — ED Provider Notes (Signed)
I saw and evaluated the patient, reviewed the resident's note and I agree with the findings and plan.  I was present and performed a significant amount of the incision and drainage of his perianal abscess.  Patient be placed on antibiotics.  Warm water soaks.  General surgery followup.  He understands return to the ER for new or worsening symptoms  Ct Pelvis W Contrast  12/11/2013   CLINICAL DATA:  Gluteal crease region abscess  EXAM: CT PELVIS WITH CONTRAST  TECHNIQUE: Multidetector CT imaging of the pelvis was performed using the standard protocol following the bolus administration of intravenous contrast.  CONTRAST:  116mL OMNIPAQUE IOHEXOL 300 MG/ML  SOLN  COMPARISON:  December 25, 2012  FINDINGS: There is a ring-enhancing lesion consistent with abscess in the posterior perineum near the gluteal crease measuring 3.9 x 2.4 x 1.9 cm. There is no surrounding mesenteric stranding or fistula apparent. This area is slightly smaller than an apparent abscess in this similar area on prior CT examination. No other inflammatory focus or abscess is seen in the pelvis. There are a few sigmoid diverticula without diverticulitis. Urinary bladder is midline with normal wall thickness. No ascites or adenopathy is seen in the pelvic region. Terminal ileum is visualized and appears within normal limits. There are no blastic or lytic bone lesions.  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   Electronically Signed   By: Lowella Grip M.D.   On: 12/11/2013 14:05  I personally reviewed the imaging tests through PACS system I reviewed available ER/hospitalization records through the [place], MD 12/11/13 [phone]

## 2013-12-11 NOTE — Discharge Instructions (Signed)
You were seen in the ED today for your perianal abscess which was successfully drained. Please take the prescribed antibiotic twice daily for one week, the pain medication every four hours as needed and the constipation medication daily as needed. Please take frequent sitz baths as discussed. Please schedule follow-up with general surgery within one week. Please seek medical attention or return to the ED if you develop new or worsening abscess, fevers, chills, night sweats, or other worrisome medical conditions.

## 2013-12-11 NOTE — ED Notes (Addendum)
MD Campos at bedside.  

## 2013-12-11 NOTE — ED Provider Notes (Signed)
CSN: [id number]     Arrival date & time 12/11/13  1030 History   First MD Initiated Contact with Patient 12/11/13 1043     Chief Complaint  Patient presents with  . Abscess     (Consider location/radiation/quality/duration/timing/severity/associated sxs/prior Treatment) Patient is a 28 y.o. male presenting with abscess.  Abscess Associated symptoms: no fever, no nausea and no vomiting     Mr Minnehan is a 28 year old man with recurrent perirectal abscesses and asthma who presents with 2-3d of perirectal pain. He was in his usual state of health until 2-3 days ago when he began to have sharp perirectal pain. He also says he has had subjective fevers and chills and has been taking tylenol. He last had this issue one year ago and had his abscess drained at the bedside following CT visualization.   Past Medical History  Diagnosis Date  . Hernia   . Asthma   . Flu 12/14  . Pneumonia 12/13  . Kidney stones    Past Surgical History  Procedure Laterality Date  . Hernia repair     No family history on file. History  Substance Use Topics  . Smoking status: Current Every Day Smoker -- 0.50 packs/day    Types: Cigarettes  . Smokeless tobacco: Not on file  . Alcohol Use: Yes     Comment: occ    Review of Systems  Constitutional: Negative for fever, chills and diaphoresis.  Respiratory: Negative for shortness of breath.   Cardiovascular: Negative for chest pain and palpitations.  Gastrointestinal: Positive for rectal pain. Negative for nausea, vomiting, abdominal pain, diarrhea, constipation, blood in stool and anal bleeding.      Allergies  Review of patient's allergies indicates no known allergies.  Home Medications   Prior to Admission medications   Medication Sig Start Date End Date Taking? Authorizing Provider  acetaminophen (TYLENOL) 500 MG tablet Take 1,000 mg by mouth every 6 (six) hours as needed for mild pain.   Yes Historical Provider, MD  albuterol (PROVENTIL  HFA;VENTOLIN HFA) 108 (90 BASE) MCG/ACT inhaler Inhale 1 puff into the lungs every 6 (six) hours as needed for wheezing or shortness of breath.    Historical Provider, MD   BP 104/63  Pulse 59  Temp(Src) 97.7 F (36.5 C) (Oral)  Resp 16  SpO2 99% Physical Exam  Constitutional: He appears well-developed and well-nourished. He appears distressed.  HENT:  Head: Normocephalic and atraumatic.  Mouth/Throat: Oropharynx is clear and moist.  Eyes: EOM are normal. Pupils are equal, round, and reactive to light. No scleral icterus.  Cardiovascular: Normal rate, regular rhythm, normal heart sounds and intact distal pulses.  Exam reveals no gallop and no friction rub.   No murmur heard. Pulmonary/Chest: Effort normal and breath sounds normal. No respiratory distress.  Abdominal: Soft. Bowel sounds are normal. He exhibits no distension. There is no tenderness.  Genitourinary: Rectum normal.  Two lesions superior/posterior to the anus. Very tender to palpation. No erythema, swelling. More posterior looks like previously opened abscess that has healed some.  Skin: He is not diaphoretic.    ED Course  INCISION AND DRAINAGE Date/Time: 12/11/2013 3:08 PM Performed by: Ethelene Hal, Timoty Bourke L Authorized by: Hoy Morn Consent: Verbal consent obtained. Risks and benefits: risks, benefits and alternatives were discussed Consent given by: patient Patient understanding: patient states understanding of the procedure being performed Patient consent: the patient's understanding of the procedure matches consent given Procedure consent: procedure consent matches procedure scheduled Relevant documents: relevant  documents present and verified Test results: test results available and properly labeled Imaging studies: imaging studies available Patient identity confirmed: verbally with patient Type: abscess Body area: anogenital Local anesthetic: lidocaine 1% with epinephrine Patient sedated: no Scalpel size:  11 Incision type: single straight Complexity: simple Drainage: purulent Drainage amount: moderate Wound treatment: wound left open Patient tolerance: Patient tolerated the procedure well with no immediate complications.   (including critical care time) Labs Review Labs Reviewed  CBC - Abnormal; Notable for the following:    WBC 11.5 (*)    All other components within normal limits  BASIC METABOLIC PANEL - Abnormal; Notable for the following:    GFR calc non Af Amer 87 (*)    All other components within normal limits    Imaging Review Ct Pelvis W Contrast  12/11/2013   CLINICAL DATA:  Gluteal crease region abscess  EXAM: CT PELVIS WITH CONTRAST  TECHNIQUE: Multidetector CT imaging of the pelvis was performed using the standard protocol following the bolus administration of intravenous contrast.  CONTRAST:  137mL OMNIPAQUE IOHEXOL 300 MG/ML  SOLN  COMPARISON:  December 25, 2012  FINDINGS: There is a ring-enhancing lesion consistent with abscess in the posterior perineum near the gluteal crease measuring 3.9 x 2.4 x 1.9 cm. There is no surrounding mesenteric stranding or fistula apparent. This area is slightly smaller than an apparent abscess in this similar area on prior CT examination. No other inflammatory focus or abscess is seen in the pelvis. There are a few sigmoid diverticula without diverticulitis. Urinary bladder is midline with normal wall thickness. No ascites or adenopathy is seen in the pelvic region. Terminal ileum is visualized and appears within normal limits. There are no blastic or lytic bone lesions.  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   Electronically Signed   By: Lowella Grip M.D.   On: 12/11/2013 14:05     EKG Interpretation None      MDM   Final diagnoses:  None    12:07PM: Patient has recurrent perianal abscess superior/posterior to anus. It is very tender with no apparent drainage or erythema. Will get CBC, BMP and CT  pelvis to evaluate extent of lesion and whether it should be excised bedside versus by surgery. Also give dilaudid 1 mg iv for pain.  3:05PM: CT demonstrated recurrent perianal abscess without tracking to the rectum. Consent was obtained from the patient for I&D. He received an additional dilaudid 1 mg and versed 2 mg. The area was cleaned with iodine, numbed with lidocaine 1% w epi and the abscess was successfully incised w expression of white purulent material without complication.  Kelby Aline, MD 12/11/13 [phone]

## 2015-03-09 ENCOUNTER — Emergency Department (HOSPITAL_COMMUNITY): Payer: Self-pay

## 2015-03-09 ENCOUNTER — Encounter (HOSPITAL_COMMUNITY): Payer: Self-pay | Admitting: Emergency Medicine

## 2015-03-09 ENCOUNTER — Emergency Department (HOSPITAL_COMMUNITY)
Admission: EM | Admit: 2015-03-09 | Discharge: 2015-03-09 | Disposition: A | Discharge status: Home / Self Care | Payer: Self-pay | Attending: Emergency Medicine | Admitting: Emergency Medicine

## 2015-03-09 DIAGNOSIS — F17210 Nicotine dependence, cigarettes, uncomplicated: Secondary | ICD-10-CM | POA: Insufficient documentation

## 2015-03-09 DIAGNOSIS — Z8619 Personal history of other infectious and parasitic diseases: Secondary | ICD-10-CM | POA: Insufficient documentation

## 2015-03-09 DIAGNOSIS — Z8719 Personal history of other diseases of the digestive system: Secondary | ICD-10-CM | POA: Insufficient documentation

## 2015-03-09 DIAGNOSIS — Y998 Other external cause status: Secondary | ICD-10-CM | POA: Insufficient documentation

## 2015-03-09 DIAGNOSIS — Y9389 Activity, other specified: Secondary | ICD-10-CM | POA: Insufficient documentation

## 2015-03-09 DIAGNOSIS — S62634A Displaced fracture of distal phalanx of right ring finger, initial encounter for closed fracture: Secondary | ICD-10-CM | POA: Insufficient documentation

## 2015-03-09 DIAGNOSIS — S62636A Displaced fracture of distal phalanx of right little finger, initial encounter for closed fracture: Secondary | ICD-10-CM

## 2015-03-09 DIAGNOSIS — J45909 Unspecified asthma, uncomplicated: Secondary | ICD-10-CM | POA: Insufficient documentation

## 2015-03-09 DIAGNOSIS — Y9289 Other specified places as the place of occurrence of the external cause: Secondary | ICD-10-CM | POA: Insufficient documentation

## 2015-03-09 DIAGNOSIS — Z79899 Other long term (current) drug therapy: Secondary | ICD-10-CM | POA: Insufficient documentation

## 2015-03-09 DIAGNOSIS — S62633A Displaced fracture of distal phalanx of left middle finger, initial encounter for closed fracture: Secondary | ICD-10-CM

## 2015-03-09 DIAGNOSIS — Z87442 Personal history of urinary calculi: Secondary | ICD-10-CM | POA: Insufficient documentation

## 2015-03-09 DIAGNOSIS — Z8701 Personal history of pneumonia (recurrent): Secondary | ICD-10-CM | POA: Insufficient documentation

## 2015-03-09 DIAGNOSIS — IMO0001 Reserved for inherently not codable concepts without codable children: Secondary | ICD-10-CM

## 2015-03-09 MED ORDER — IBUPROFEN 800 MG PO TABS: 800.0000 mg | ORAL_TABLET | Freq: Three times a day (TID) | ORAL | Status: DC

## 2015-03-09 MED ORDER — OXYCODONE-ACETAMINOPHEN 5-325 MG PO TABS: 1.0000 | ORAL_TABLET | ORAL | Status: DC | PRN

## 2015-03-09 MED ORDER — OXYCODONE-ACETAMINOPHEN 5-325 MG PO TABS
2.0000 | ORAL_TABLET | Freq: Once | ORAL | Status: AC
  Administered 2015-03-09: 2 via ORAL
  Filled 2015-03-09: qty 2

## 2015-03-09 NOTE — Discharge Instructions (Signed)
Finger Fracture Finger fractures are breaks in the bones of the fingers. There are many types of fractures. There are also different ways of treating these fractures. Your doctor will talk with you about the best way to treat your fracture. Injury is the main cause of broken fingers. This includes:  Injuries while playing sports.  Workplace injuries.  Falls. HOME CARE  Follow your doctor's instructions for:  Activities.  Exercises.  Physical therapy.  Take medicines only as told by your doctor for pain, discomfort, or fever. GET HELP IF: You have pain or swelling that limits:  The motion of your fingers.  The use of your fingers. GET HELP RIGHT AWAY IF:  You cannot feel your fingers, or your fingers become numb.   This information is not intended to replace advice given to you by your health care provider. Make sure you discuss any questions you have with your health care provider.   Document Released: 09/29/2007 Document Revised: 05/03/2014 Document Reviewed: 11/22/2012 Elsevier Interactive Patient Education 2016 [place] or Splint Care Casts and splints support injured limbs and keep bones from moving while they heal.  HOME CARE  Keep the cast or splint uncovered during the drying period.  A plaster cast can take 24 to 48 hours to dry.  A fiberglass cast will dry in less than 1 hour.  Do not rest the cast on anything harder than a pillow for 24 hours.  Do not put weight on your injured limb. Do not put pressure on the cast. Wait for your doctor's approval.  Keep the cast or splint dry.  Cover the cast or splint with a plastic bag during baths or wet weather.  If you have a cast over your chest and belly (trunk), take sponge baths until the cast is taken off.  If your cast gets wet, dry it with a towel or blow dryer. Use the cool setting on the blow dryer.  Keep your cast or splint clean. Wash a dirty cast with a damp cloth.  Do not put any  objects under your cast or splint.  Do not scratch the skin under the cast with an object. If itching is a problem, use a blow dryer on a cool setting over the itchy area.  Do not trim or cut your cast.  Do not take out the padding from inside your cast.  Exercise your joints near the cast as told by your doctor.  Raise (elevate) your injured limb on 1 or 2 pillows for the first 1 to 3 days. GET HELP IF:  Your cast or splint cracks.  Your cast or splint is too tight or too loose.  You itch badly under the cast.  Your cast gets wet or has a soft spot.  You have a bad smell coming from the cast.  You get an object stuck under the cast.  Your skin around the cast becomes red or sore.  You have new or more pain after the cast is put on. GET HELP RIGHT AWAY IF:  You have fluid leaking through the cast.  You cannot move your fingers or toes.  Your fingers or toes turn blue or white or are cool, painful, or puffy (swollen).  You have tingling or lose feeling (numbness) around the injured area.  You have bad pain or pressure under the cast.  You have trouble breathing or have shortness of breath.  You have chest pain.   This information is not intended to  replace advice given to you by your health care provider. Make sure you discuss any questions you have with your health care provider.   Document Released: 08/12/2010 Document Revised: 12/13/2012 Document Reviewed: 10/19/2012 Elsevier Interactive Patient Education [employer].
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
## 2016-07-02 ENCOUNTER — Emergency Department (HOSPITAL_COMMUNITY)
Admission: EM | Admit: 2016-07-02 | Discharge: 2016-07-02 | Disposition: A | Discharge status: Home / Self Care | Payer: Self-pay | Attending: Emergency Medicine | Admitting: Emergency Medicine

## 2016-07-02 ENCOUNTER — Encounter (HOSPITAL_COMMUNITY): Payer: Self-pay | Admitting: Emergency Medicine

## 2016-07-02 DIAGNOSIS — K047 Periapical abscess without sinus: Secondary | ICD-10-CM | POA: Insufficient documentation

## 2016-07-02 DIAGNOSIS — F17210 Nicotine dependence, cigarettes, uncomplicated: Secondary | ICD-10-CM | POA: Insufficient documentation

## 2016-07-02 DIAGNOSIS — J45909 Unspecified asthma, uncomplicated: Secondary | ICD-10-CM | POA: Insufficient documentation

## 2016-07-02 MED ORDER — TRAMADOL HCL 50 MG PO TABS
50.0000 mg | ORAL_TABLET | Freq: Once | ORAL | Status: AC
  Administered 2016-07-02: 50 mg via ORAL
  Filled 2016-07-02: qty 1

## 2016-07-02 MED ORDER — AMOXICILLIN 500 MG PO CAPS: 500.0000 mg | ORAL_CAPSULE | Freq: Three times a day (TID) | ORAL | 0 refills | Status: DC

## 2016-07-02 MED ORDER — TRAMADOL HCL 50 MG PO TABS: 50.0000 mg | ORAL_TABLET | Freq: Four times a day (QID) | ORAL | 0 refills | Status: DC | PRN

## 2016-07-02 MED ORDER — AMOXICILLIN 500 MG PO CAPS
500.0000 mg | ORAL_CAPSULE | Freq: Once | ORAL | Status: AC
  Administered 2016-07-02: 500 mg via ORAL
  Filled 2016-07-02: qty 1

## 2016-07-02 NOTE — Discharge Instructions (Signed)
Complete your entire course of antibiotics as prescribed.  You  may use the tramadol for pain relief but do not drive within 4 hours of taking as this will make you drowsy.  Avoid applying heat or ice to this abscess area which can worsen your symptoms.  You may use warm salt water swish and spit treatment or half peroxide and water swish and spit after meals to keep this area clean as discussed.  Call the dentist listed above for further management of your symptoms.  You should find that your pain is relieved by your home motrin once you have been on the antibiotic for 1-2 days.

## 2016-07-02 NOTE — ED Triage Notes (Signed)
Pt reports pain, tenderness and swelling in r/upper jaw x 3 days

## 2016-07-02 NOTE — ED Provider Notes (Signed)
[facility] DEPT Provider Note   CSN: [id number] Arrival date & time: 07/02/16  1208   By signing my name below, I, Neta Mends, attest that this documentation has been prepared under the direction and in the presence of Evalee Jefferson, PA-C. Electronically Signed: Neta Mends, ED Scribe. 07/02/2016. 12:35 PM.   History   Chief Complaint Chief Complaint  Patient presents with  . Dental Pain    The history is provided by the patient. No language interpreter was used.   HPI Comments:  Robert Peck is a 31 y.o. male who presents to the Emergency Department complaining of constant, worsening dental pain x 2 days. Pt statse that the pain is primarily on his upper right teeth and gums. Pt complains of associated facial swelling, subjective fever. He notes a broken tooth I this area that he states has been broken for ~1 month. Pt has taken motrin with no relief, last was 2 tablets this AM. Pt denies any drainage.     Past Medical History:  Diagnosis Date  . Asthma   . Flu 12/14  . Hernia   . Kidney stones   . Pneumonia 12/13    There are no active problems to display for this patient.   Past Surgical History:  Procedure Laterality Date  . HERNIA REPAIR         Home Medications    Prior to Admission medications   Medication Sig Start Date End Date Taking? Authorizing Provider  acetaminophen (TYLENOL) 500 MG tablet Take 1,000 mg by mouth every 6 (six) hours as needed for mild pain.    Historical Provider, MD  albuterol (PROVENTIL HFA;VENTOLIN HFA) 108 (90 BASE) MCG/ACT inhaler Inhale 1 puff into the lungs every 6 (six) hours as needed for wheezing or shortness of breath.    Historical Provider, MD  amoxicillin (AMOXIL) 500 MG capsule Take 1 capsule (500 mg total) by mouth 3 (three) times daily. 07/02/16   Evalee Jefferson, PA-C  bisacodyl (DULCOLAX) 5 MG EC tablet Take 1 tablet (5 mg total) by mouth daily as needed for moderate constipation. 12/11/13   Kelby Aline, MD  ibuprofen (ADVIL,MOTRIN) 800 MG tablet Take 1 tablet (800 mg total) by mouth 3 (three) times daily. 03/09/15   Delsa Grana, PA-C  oxyCODONE-acetaminophen (PERCOCET/ROXICET) 5-325 MG tablet Take 1-2 tablets by mouth every 4 (four) hours as needed for severe pain. 03/09/15   Delsa Grana, PA-C  sulfamethoxazole-trimethoprim (BACTRIM DS,SEPTRA DS) 800-160 MG per tablet Take 1 tablet by mouth 2 (two) times daily. 12/11/13   Kelby Aline, MD  traMADol (ULTRAM) 50 MG tablet Take 1 tablet (50 mg total) by mouth every 6 (six) hours as needed. 07/02/16   Evalee Jefferson, PA-C    Family History Family History  Problem Relation Age of Onset  . Diabetes Mother     Social History Social History  Substance Use Topics  . Smoking status: Current Every Day Smoker    Packs/day: 0.50    Types: Cigarettes  . Smokeless tobacco: Never Used  . Alcohol use Yes     Comment: occ     Allergies   Patient has no known allergies.   Review of Systems Review of Systems  Constitutional: Positive for fever.  HENT: Positive for dental problem and facial swelling.      Physical Exam Updated Vital Signs BP 139/91 (BP Location: Left Arm)   Pulse 83   Temp 98.1 F (36.7 C) (Oral)   Resp 18  Wt 102.1 kg   SpO2 99%   BMI 28.89 kg/m   Physical Exam  Constitutional: He is oriented to person, place, and time. He appears well-developed and well-nourished. No distress.  HENT:  Head: Normocephalic and atraumatic.  Right Ear: Tympanic membrane and external ear normal.  Left Ear: Tympanic membrane and external ear normal.  Mouth/Throat: Oropharynx is clear and moist and mucous membranes are normal. No oral lesions. No trismus in the jaw. Dental abscesses present.  Edema right upper gingiva with fractured 1st molar tooth, no drainage, no induration or identified fluctuant abscess.  Slight edema noted right cheek, no involving periorbital or nose. No erythema or induration.  Eyes: Conjunctivae are  normal.  Neck: Normal range of motion. Neck supple.  Cardiovascular: Normal rate and normal heart sounds.   Pulmonary/Chest: Effort normal.  Musculoskeletal: Normal range of motion.  Lymphadenopathy:    He has no cervical adenopathy.  Neurological: He is alert and oriented to person, place, and time.  Skin: Skin is warm and dry. No erythema.  Psychiatric: He has a normal mood and affect.     ED Treatments / Results  DIAGNOSTIC STUDIES:  Oxygen Saturation is 99% on RA, normal by my interpretation.    COORDINATION OF CARE:  12:32 PM Will prescribe antibiotic and refer to a dentist. Discussed treatment plan with pt at bedside and pt agreed to plan.   Labs (all labs ordered are listed, but only abnormal results are displayed) Labs Reviewed - No data to display  EKG  EKG Interpretation None       Radiology No results found.  Procedures Procedures (including critical care time)  Medications Ordered in ED Medications  amoxicillin (AMOXIL) capsule 500 mg (not administered)  traMADol (ULTRAM) tablet 50 mg (not administered)     Initial Impression / Assessment and Plan / ED Course  I have reviewed the triage vital signs and the nursing notes.  Pertinent labs & imaging results that were available during my care of the patient were reviewed by me and considered in my medical decision making (see chart for details).     Amoxil, tramadol.  Dental referrals.    [place] controlled substance database reviewed.   Final Clinical Impressions(s) / ED Diagnoses   Final diagnoses:  Dental abscess    New Prescriptions New Prescriptions   AMOXICILLIN (AMOXIL) 500 MG CAPSULE    Take 1 capsule (500 mg total) by mouth 3 (three) times daily.   TRAMADOL (ULTRAM) 50 MG TABLET    Take 1 tablet (50 mg total) by mouth every 6 (six) hours as needed.  I personally performed the services described in this documentation, which was scribed in my presence. The recorded information has been  reviewed and is accurate.     Evalee Jefferson, PA-C 07/02/16 [place], MD 07/02/16 [phone]

## 2016-10-04 ENCOUNTER — Emergency Department (HOSPITAL_COMMUNITY): Payer: Self-pay

## 2016-10-04 ENCOUNTER — Emergency Department (HOSPITAL_COMMUNITY)
Admission: EM | Admit: 2016-10-04 | Discharge: 2016-10-04 | Disposition: A | Discharge status: Home / Self Care | Payer: Self-pay | Attending: Emergency Medicine | Admitting: Emergency Medicine

## 2016-10-04 ENCOUNTER — Encounter (HOSPITAL_COMMUNITY): Payer: Self-pay

## 2016-10-04 DIAGNOSIS — N5089 Other specified disorders of the male genital organs: Secondary | ICD-10-CM

## 2016-10-04 DIAGNOSIS — N509 Disorder of male genital organs, unspecified: Secondary | ICD-10-CM | POA: Insufficient documentation

## 2016-10-04 DIAGNOSIS — F17210 Nicotine dependence, cigarettes, uncomplicated: Secondary | ICD-10-CM | POA: Insufficient documentation

## 2016-10-04 DIAGNOSIS — Z79899 Other long term (current) drug therapy: Secondary | ICD-10-CM | POA: Insufficient documentation

## 2016-10-04 DIAGNOSIS — J45909 Unspecified asthma, uncomplicated: Secondary | ICD-10-CM | POA: Insufficient documentation

## 2016-10-04 LAB — URINALYSIS, ROUTINE W REFLEX MICROSCOPIC
BILIRUBIN URINE: NEGATIVE
Bacteria, UA: NONE SEEN
Glucose, UA: 50 mg/dL — AB
Ketones, ur: NEGATIVE mg/dL
LEUKOCYTES UA: NEGATIVE
Nitrite: NEGATIVE
PH: 6 (ref 5.0–8.0)
Protein, ur: NEGATIVE mg/dL
SPECIFIC GRAVITY, URINE: 1.031 — AB (ref 1.005–1.030)
SQUAMOUS EPITHELIAL / LPF: NONE SEEN

## 2016-10-04 LAB — LACTATE DEHYDROGENASE: LDH: 306 U/L — ABNORMAL HIGH (ref 98–192)

## 2016-10-04 MED ORDER — OXYCODONE-ACETAMINOPHEN 5-325 MG PO TABS
1.0000 | ORAL_TABLET | Freq: Once | ORAL | Status: AC
  Administered 2016-10-04: 1 via ORAL
  Filled 2016-10-04: qty 1

## 2016-10-04 MED ORDER — HYDROCODONE-ACETAMINOPHEN 5-325 MG PO TABS: 1.0000 | ORAL_TABLET | ORAL | 0 refills | Status: DC | PRN

## 2016-10-04 MED ORDER — OXYCODONE-ACETAMINOPHEN 5-325 MG PO TABS: 1.0000 | ORAL_TABLET | ORAL | 0 refills | Status: DC | PRN

## 2016-10-04 NOTE — ED Provider Notes (Signed)
  31 year old male presents today with right testicular swelling. Patient care was assigned to me by previous provider at shift change. Patient's ultrasound returned showing significant scrotal mass concerning for neoplasm. I spoke with on-call urologist who recommended further laboratory analysis, and that he would personally evaluate the patient here in the ED.    Patient will be followed as an outpatient. Discharged with return precautions and follow-up information. No further questions or concerns at time of discharge   Francee Gentile 10/04/16 2322    Fredia Sorrow, MD 10/06/16 [phone]

## 2016-10-04 NOTE — ED Triage Notes (Signed)
Patient states he has been having right testicular pain and swelling x 1 month. Pain and swelling worse in the past few days. Patient reports that the pain radiates into the abdomen.

## 2016-10-04 NOTE — ED Notes (Signed)
Ultrasound at the bedside

## 2016-10-04 NOTE — ED Notes (Signed)
Lab notified of new orders for UA

## 2016-10-04 NOTE — Consult Note (Signed)
I have been asked to see the patient by Dr. Lajean Saver, for evaluation and management of right testicular pain.  History of present illness: 31 year old otherwise healthy male presented to the emergency department tonight with right-sided testicular pain. The patient states that the pain has been intermittent for the past month and a half. However, overnight he had excruciating pain is unable to sleep. Exam in the emergency room demonstrating an abnormally large and firm irregular right testicle. An ultrasound wasn't performed demonstrating 5 cm heterogeneous testicular mass with necrosis. The patient's pain is at the posterior aspect of the testicle and radiates up into the inguinal canal. It is tender to palpation. The patient has not tried any additional medications for.  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 He also has a left inguinal hernia repair at the same time.  Review of systems: A 12 point comprehensive review of systems was obtained and is negative unless otherwise stated in the history of present illness.  There are no active problems to display for this patient.   No current facility-administered medications on file prior to encounter.    Current Outpatient Prescriptions on File Prior to Encounter  Medication Sig Dispense Refill  . amoxicillin (AMOXIL) 500 MG capsule Take 1 capsule (500 mg total) by mouth 3 (three) times daily. (Patient not taking: Reported on 10/04/2016) 21 capsule 0  . bisacodyl (DULCOLAX) 5 MG EC tablet Take 1 tablet (5 mg total) by mouth daily as needed for moderate constipation. (Patient not taking: Reported on 10/04/2016) 30 tablet 0  . ibuprofen (ADVIL,MOTRIN) 800 MG tablet Take 1 tablet (800 mg total) by mouth 3 (three) times daily. (Patient not taking: Reported on 10/04/2016) 21 tablet 0  . oxyCODONE-acetaminophen (PERCOCET/ROXICET) 5-325 MG tablet Take 1-2 tablets by mouth every 4 (four) hours as needed  for severe pain. (Patient not taking: Reported on 10/04/2016) 20 tablet 0  . sulfamethoxazole-trimethoprim (BACTRIM DS,SEPTRA DS) 800-160 MG per tablet Take 1 tablet by mouth 2 (two) times daily. (Patient not taking: Reported on 10/04/2016) 14 tablet 0  . traMADol (ULTRAM) 50 MG tablet Take 1 tablet (50 mg total) by mouth every 6 (six) hours as needed. (Patient not taking: Reported on 10/04/2016) 15 tablet 0    Past Medical History:  Diagnosis Date  . Asthma   . Flu 12/14  . Hernia   . Kidney stones   . Pneumonia 12/13    Past Surgical History:  Procedure Laterality Date  . HERNIA REPAIR      Social History  Substance Use Topics  . Smoking status: Current Every Day Smoker    Packs/day: 0.50    Types: Cigarettes  . Smokeless tobacco: Never Used  . Alcohol use Yes     Comment: occ    Family History  Problem Relation Age of Onset  . Diabetes Mother     PE: Vitals:   10/04/16 1249 10/04/16 1642 10/04/16 1929  BP: 136/86 117/66 117/71  Pulse: 98 64 63  Resp: 17 18 16   Temp: 98.9 F (37.2 C)  98.3 F (36.8 C)  TempSrc: Oral  Oral  SpO2: 99% 98% 99%  Weight: 91.6 kg (202 lb)    Height: 6\' 2"  (1.88 m)     Patient appears to be in no acute distress  patient is alert and oriented x3 Atraumatic normocephalic head No cervical or supraclavicular lymphadenopathy appreciated No increased work of breathing, no audible wheezes/rhonchi Regular sinus rhythm/rate Abdomen  is soft, nontender, nondistended, no CVA or suprapubic tenderness Patient's right hemiscrotum is enlarged with a large irregular testicular mass. He has tenderness the posterior aspect which is likely the epididymis although the anatomy is quite distorted. He has tenderness in the inguinal canal over the spermatic cord. Lower extremities are symmetric without appreciable edema Grossly neurologically intact No identifiable skin lesions  No results for input(s): WBC, HGB, HCT in the last 72 hours. No results for  input(s): NA, K, CL, CO2, GLUCOSE, BUN, CREATININE, CALCIUM in the last 72 hours. No results for input(s): LABPT, INR in the last 72 hours. No results for input(s): LABURIN in the last 72 hours. Results for orders placed or performed during the hospital encounter of 05/30/12  Rapid strep screen     Status: None   Collection Time: 05/30/12  3:55 PM  Result Value Ref Range Status   Streptococcus, Group A Screen (Direct) NEGATIVE NEGATIVE Final    Comment:        DUE TO INADEQUATE SENSITIVITY OF EIA RAPID TESTS FOR GROUP A STREP (GAS) IT IS RECOMMENDED THAT ALL NEGATIVE RESULTS BE FOLLOWED BY A GROUP A STREP PROBE.    Imaging: I've independently reviewed the patient's testicular ultrasound which demonstrates a very irregular 7.2 times 4.3 times 5.7 centimeter heterogeneous mass in the right testicle with a necrotic center. There is little normal tissue in the right testicle. The patient's left testicle appears to be otherwise normal.  Imp: The patient has a very large right irregular testicular mass which is concerning for testicular cancer.  Recommendations: I spoke to the patient about my concerns for his right testicle and his risk for cancer. I recommended that we proceed with a right radical orchiectomy in the near future. I explained to the patient the surgery as well as recovery. We discussed the risks and benefits thereof.  In addition to surgery, the patient had tumor markers that were drawn today in the emergency department as part of his evaluation. We will follow these up and use these as part of our tumor staging if he doesn't fact have cancer.  I candidly spoke to the patient about testicular cancer, I reassured him that this is a very treatable disease, and that with close follow-up, his survival rate would be very good.  Louis Meckel W

## 2016-10-04 NOTE — ED Provider Notes (Signed)
[facility] DEPT Provider Note   CSN: [id number] Arrival date & time: 10/04/16  1244     History   Chief Complaint Chief Complaint  Patient presents with  . Abdominal Pain  . Testicle Pain    HPI Robert Peck is a 31 y.o. male presents today for right testicular pain and swelling 1.5 months. The patient notes that one and a half months ago he started experiencing right testicular pain with associated swelling and firmness of the testicle. He states this is a sharp pain that is worse with walking, sitting down, lying. He only receives relief when he elevates his testicle. The patient denies any associated fever, penile pain, penile discharge, rash, nausea, vomiting, hematuria, urinary frequency. Patient denies prior STI's.   HPI  Past Medical History:  Diagnosis Date  . Asthma   . Flu 12/14  . Hernia   . Kidney stones   . Pneumonia 12/13    There are no active problems to display for this patient.   Past Surgical History:  Procedure Laterality Date  . HERNIA REPAIR         Home Medications    Prior to Admission medications   Medication Sig Start Date End Date Taking? Authorizing Provider  amoxicillin (AMOXIL) 500 MG capsule Take 1 capsule (500 mg total) by mouth 3 (three) times daily. Patient not taking: Reported on 10/04/2016 07/02/16   Evalee Jefferson, PA-C  bisacodyl (DULCOLAX) 5 MG EC tablet Take 1 tablet (5 mg total) by mouth daily as needed for moderate constipation. Patient not taking: Reported on 10/04/2016 12/11/13   Kelby Aline, MD  ibuprofen (ADVIL,MOTRIN) 800 MG tablet Take 1 tablet (800 mg total) by mouth 3 (three) times daily. Patient not taking: Reported on 10/04/2016 03/09/15   Delsa Grana, PA-C  oxyCODONE-acetaminophen (PERCOCET/ROXICET) 5-325 MG tablet Take 1-2 tablets by mouth every 4 (four) hours as needed for severe pain. Patient not taking: Reported on 10/04/2016 03/09/15   Delsa Grana, PA-C  sulfamethoxazole-trimethoprim (BACTRIM  DS,SEPTRA DS) 800-160 MG per tablet Take 1 tablet by mouth 2 (two) times daily. Patient not taking: Reported on 10/04/2016 12/11/13   Kelby Aline, MD  traMADol (ULTRAM) 50 MG tablet Take 1 tablet (50 mg total) by mouth every 6 (six) hours as needed. Patient not taking: Reported on 10/04/2016 07/02/16   Evalee Jefferson, PA-C    Family History Family History  Problem Relation Age of Onset  . Diabetes Mother     Social History Social History  Substance Use Topics  . Smoking status: Current Every Day Smoker    Packs/day: 0.50    Types: Cigarettes  . Smokeless tobacco: Never Used  . Alcohol use Yes     Comment: occ     Allergies   Patient has no known allergies.   Review of Systems Review of Systems  All other systems reviewed and are negative.    Physical Exam Updated Vital Signs BP 136/86 (BP Location: Left Arm)   Pulse 98   Temp 98.9 F (37.2 C) (Oral)   Resp 17   Ht 6\' 2"  (1.88 m)   Wt 91.6 kg (202 lb)   SpO2 99%   BMI 25.94 kg/m   Physical Exam  Constitutional: He appears well-developed and well-nourished.  HENT:  Head: Normocephalic and atraumatic.  Eyes: Conjunctivae are normal. Right eye exhibits no discharge. Left eye exhibits no discharge. No scleral icterus.  Pulmonary/Chest: Effort normal. No respiratory distress.  Abdominal: Soft. Bowel sounds are normal. There is  no tenderness. There is no rigidity, no rebound, no guarding, no CVA tenderness and negative Murphy's sign.  Genitourinary: Right testis shows no swelling and no tenderness.  Genitourinary Comments: No penile tenderness or discharge. No rash or ulceration. Firm right enlarged testicle, with associated swelling. Positive phren's sign on the right side. Left testicle unremarkable. No rash. No LAD b/l. Femoral pulses 2+ b/l. No obvious hernia on palpation.   Neurological: He is alert.  Skin: No pallor.  Psychiatric: He has a normal mood and affect.  Nursing note and vitals reviewed.   ED  Treatments / Results  Labs (all labs ordered are listed, but only abnormal results are displayed) Labs Reviewed  RPR  HIV ANTIBODY (ROUTINE TESTING)  URINALYSIS, ROUTINE W REFLEX MICROSCOPIC  GC/CHLAMYDIA PROBE AMP ([place]) NOT AT [facility]    EKG  EKG Interpretation None       Radiology No results found.  Procedures Procedures (including critical care time)  Medications Ordered in ED Medications  oxyCODONE-acetaminophen (PERCOCET/ROXICET) 5-325 MG per tablet 1 tablet (1 tablet Oral Given 10/04/16 1436)     Initial Impression / Assessment and Plan / ED Course  I have reviewed the triage vital signs and the nursing notes.  Pertinent labs & imaging results that were available during my care of the patient were reviewed by me and considered in my medical decision making (see chart for details).      31 y.o. male presents today for right testicular pain and swelling 1.5 months. Patient has normal vital signs. Non-toxic appearing on exam. There is right scrotal pain, swelling. The testicle is enlarged and firm. Positive phren's sign.   Will obtain a scrotal and testicular ultrasound, STI panel and UA.   Awaiting result. Patient signed out and discussed with [name], PA-C who will take over case   Final Clinical Impressions(s) / ED Diagnoses   Final diagnoses:  None    New Prescriptions New Prescriptions   No medications on file     Lorelle Gibbs 10/04/16 1549    Lajean Saver, MD 10/05/16 [phone]

## 2016-10-04 NOTE — ED Notes (Signed)
Pt sts he is not refusing blood draw but would like someone to come in and explain what is going on to him before blood draw is done...  RN notified, infomed pt that I would have someone come talk to him as soon as I could.

## 2016-10-04 NOTE — Discharge Instructions (Signed)
Please contact Dr. Carlton Adam office and schedule follow-up evaluation and procedure. If he experiences any new or worsening signs or symptoms please return to the emergency room immediately.

## 2016-10-05 ENCOUNTER — Other Ambulatory Visit: Payer: Self-pay | Admitting: Urology

## 2016-10-05 ENCOUNTER — Encounter (HOSPITAL_COMMUNITY): Payer: Self-pay

## 2016-10-05 ENCOUNTER — Encounter (HOSPITAL_COMMUNITY)
Admission: RE | Admit: 2016-10-05 | Discharge: 2016-10-05 | Disposition: A | Discharge status: Home / Self Care | Payer: Self-pay | Source: Ambulatory Visit | Attending: Urology | Admitting: Urology

## 2016-10-05 DIAGNOSIS — Z01812 Encounter for preprocedural laboratory examination: Secondary | ICD-10-CM | POA: Insufficient documentation

## 2016-10-05 HISTORY — DX: Other specified disorders of the male genital organs: N50.89

## 2016-10-05 LAB — CBC
HCT: 45 % (ref 39.0–52.0)
Hemoglobin: 15.7 g/dL (ref 13.0–17.0)
MCH: 32.1 pg (ref 26.0–34.0)
MCHC: 34.9 g/dL (ref 30.0–36.0)
MCV: 92 fL (ref 78.0–100.0)
PLATELETS: 259 10*3/uL (ref 150–400)
RBC: 4.89 MIL/uL (ref 4.22–5.81)
RDW: 12.2 % (ref 11.5–15.5)
WBC: 11 10*3/uL — ABNORMAL HIGH (ref 4.0–10.5)

## 2016-10-05 LAB — BASIC METABOLIC PANEL
ANION GAP: 8 (ref 5–15)
BUN: 11 mg/dL (ref 6–20)
CALCIUM: 9.2 mg/dL (ref 8.9–10.3)
CO2: 30 mmol/L (ref 22–32)
CREATININE: 1.12 mg/dL (ref 0.61–1.24)
Chloride: 100 mmol/L — ABNORMAL LOW (ref 101–111)
GFR calc Af Amer: >60 mL/min (ref >60)
GLUCOSE: 108 mg/dL — AB (ref 65–99)
Potassium: 4.1 mmol/L (ref 3.5–5.1)
Sodium: 138 mmol/L (ref 135–145)

## 2016-10-05 LAB — BETA HCG QUANT (REF LAB): Beta hCG, Tumor Marker: 28 m[IU]/mL — ABNORMAL HIGH (ref 0–3)

## 2016-10-05 LAB — GC/CHLAMYDIA PROBE AMP (~~LOC~~) NOT AT ARMC
Chlamydia: NEGATIVE
NEISSERIA GONORRHEA: NEGATIVE

## 2016-10-05 LAB — HIV ANTIBODY (ROUTINE TESTING W REFLEX): HIV Screen 4th Generation wRfx: NONREACTIVE

## 2016-10-05 LAB — RPR: RPR: NONREACTIVE

## 2016-10-05 NOTE — Patient Instructions (Addendum)
CLETE KUCH  10/05/2016   Your procedure is scheduled on: 10-07-16  Report to [facility] Main  Entrance Take [place]  elevators to 3rd floor to  [place] at  1030 AM.   Call this number if you have problems the morning of surgery [phone]   Remember: ONLY 1 PERSON MAY GO WITH YOU TO SHORT STAY TO GET  READY MORNING OF [place].  Do not eat food or drink liquids :After Midnight.     Take these medicines the morning of surgery with A SIP OF WATER: OXYCODONE IF NEEDED                               You may not have any metal on your body including hair pins and              piercings  Do not wear jewelry, make-up, lotions, powders or perfumes, deodorant             Do not wear nail polish.  Do not shave  48 hours prior to surgery.              Men may shave face and neck.   Do not bring valuables to the hospital. [place].  Contacts, dentures or bridgework may not be worn into surgery.  Leave suitcase in the car. After surgery it may be brought to your room.     Patients discharged the day of surgery will not be allowed to drive home.  Name and phone number of your driver:mother lisa Peraza cell [phone]  Special Instructions: N/A              Please read over the following fact sheets you were given: _____________________________________________________________________             [facility] - Preparing for Surgery Before surgery, you can play an important role.  Because skin is not sterile, your skin needs to be as free of germs as possible.  You can reduce the number of germs on your skin by washing with CHG (chlorahexidine gluconate) soap before surgery.  CHG is an antiseptic cleaner which kills germs and bonds with the skin to continue killing germs even after washing. Please DO NOT use if you have an allergy to CHG or antibacterial soaps.  If your skin  becomes reddened/irritated stop using the CHG and inform your nurse when you arrive at Short Stay. Do not shave (including legs and underarms) for at least 48 hours prior to the first CHG shower.  You may shave your face/neck. Please follow these instructions carefully:  1.  Shower with CHG Soap the night before surgery and the  morning of Surgery.  2.  If you choose to wash your hair, wash your hair first as usual with your  normal  shampoo.  3.  After you shampoo, rinse your hair and body thoroughly to remove the  shampoo.                           4.  Use CHG as you would any other liquid soap.  You can apply chg directly  to the skin and wash  Gently with a scrungie or clean washcloth.  5.  Apply the CHG Soap to your body ONLY FROM THE NECK DOWN.   Do not use on face/ open                           Wound or open sores. Avoid contact with eyes, ears mouth and genitals (private parts).                       Wash face,  Genitals (private parts) with your normal soap.             6.  Wash thoroughly, paying special attention to the area where your surgery  will be performed.  7.  Thoroughly rinse your body with warm water from the neck down.  8.  DO NOT shower/wash with your normal soap after using and rinsing off  the CHG Soap.                9.  Pat yourself dry with a clean towel.            10.  Wear clean pajamas.            11.  Place clean sheets on your bed the night of your first shower and do not  sleep with pets. Day of Surgery : Do not apply any lotions/deodorants the morning of surgery.  Please wear clean clothes to the hospital/surgery center.  FAILURE TO FOLLOW THESE INSTRUCTIONS MAY RESULT IN THE CANCELLATION OF YOUR SURGERY PATIENT SIGNATURE_________________________________  NURSE SIGNATURE__________________________________  ________________________________________________________________________

## 2016-10-05 NOTE — Progress Notes (Signed)
Spoke with dr Conrad [place] anesthesia and made aware of patient history of mild asthma and no current inhaler use, no ekg needed for pre op visit today per dr Conrad [place].

## 2016-10-06 NOTE — Progress Notes (Signed)
Left message with pam gibson orders for 10-07-16 surgery need dr Louis Meckel to cosign them message left 10-05-16 and 10-06-16

## 2016-10-07 ENCOUNTER — Ambulatory Visit (HOSPITAL_COMMUNITY)
Admission: RE | Admit: 2016-10-07 | Discharge: 2016-10-07 | Disposition: A | Discharge status: Home / Self Care | Payer: Self-pay | Source: Ambulatory Visit | Attending: Urology | Admitting: Urology

## 2016-10-07 ENCOUNTER — Ambulatory Visit (HOSPITAL_COMMUNITY): Payer: Self-pay | Admitting: Certified Registered Nurse Anesthetist

## 2016-10-07 ENCOUNTER — Encounter (HOSPITAL_COMMUNITY): Payer: Self-pay | Admitting: *Deleted

## 2016-10-07 ENCOUNTER — Encounter (HOSPITAL_COMMUNITY)
Admission: RE | Disposition: A | Discharge status: Home / Self Care | Payer: Self-pay | Source: Ambulatory Visit | Attending: Urology

## 2016-10-07 DIAGNOSIS — F1290 Cannabis use, unspecified, uncomplicated: Secondary | ICD-10-CM | POA: Insufficient documentation

## 2016-10-07 DIAGNOSIS — F17210 Nicotine dependence, cigarettes, uncomplicated: Secondary | ICD-10-CM | POA: Insufficient documentation

## 2016-10-07 DIAGNOSIS — J45909 Unspecified asthma, uncomplicated: Secondary | ICD-10-CM | POA: Insufficient documentation

## 2016-10-07 DIAGNOSIS — C6291 Malignant neoplasm of right testis, unspecified whether descended or undescended: Secondary | ICD-10-CM | POA: Insufficient documentation

## 2016-10-07 HISTORY — PX: ORCHIECTOMY: SHX2116

## 2016-10-07 SURGERY — ORCHIECTOMY
Anesthesia: General | Site: Abdomen | Laterality: Right

## 2016-10-07 MED ORDER — LACTATED RINGERS IV SOLN
INTRAVENOUS | Status: DC
  Administered 2016-10-07 (×2): via INTRAVENOUS

## 2016-10-07 MED ORDER — MIDAZOLAM HCL 5 MG/5ML IJ SOLN
INTRAMUSCULAR | Status: DC | PRN
  Administered 2016-10-07: 2 mg via INTRAVENOUS

## 2016-10-07 MED ORDER — DEXAMETHASONE SODIUM PHOSPHATE 10 MG/ML IJ SOLN
INTRAMUSCULAR | Status: DC | PRN
  Administered 2016-10-07: 10 mg via INTRAVENOUS

## 2016-10-07 MED ORDER — 0.9 % SODIUM CHLORIDE (POUR BTL) OPTIME
TOPICAL | Status: DC | PRN
  Administered 2016-10-07: 1000 mL

## 2016-10-07 MED ORDER — HYDROMORPHONE HCL 1 MG/ML IJ SOLN
INTRAMUSCULAR | Status: AC
  Filled 2016-10-07: qty 2

## 2016-10-07 MED ORDER — MIDAZOLAM HCL 2 MG/2ML IJ SOLN
INTRAMUSCULAR | Status: AC
  Filled 2016-10-07: qty 2

## 2016-10-07 MED ORDER — BUPIVACAINE-EPINEPHRINE 0.5% -1:200000 IJ SOLN
INTRAMUSCULAR | Status: DC | PRN
  Administered 2016-10-07: 30 mL

## 2016-10-07 MED ORDER — OXYCODONE-ACETAMINOPHEN 5-325 MG PO TABS: 1.0000 | ORAL_TABLET | ORAL | 0 refills | Status: DC | PRN

## 2016-10-07 MED ORDER — DEXAMETHASONE SODIUM PHOSPHATE 10 MG/ML IJ SOLN
INTRAMUSCULAR | Status: AC
  Filled 2016-10-07: qty 1

## 2016-10-07 MED ORDER — LIDOCAINE 2% (20 MG/ML) 5 ML SYRINGE
INTRAMUSCULAR | Status: DC | PRN
  Administered 2016-10-07: 100 mg via INTRAVENOUS

## 2016-10-07 MED ORDER — SUCCINYLCHOLINE CHLORIDE 200 MG/10ML IV SOSY
PREFILLED_SYRINGE | INTRAVENOUS | Status: AC
  Filled 2016-10-07: qty 10

## 2016-10-07 MED ORDER — PROMETHAZINE HCL 25 MG/ML IJ SOLN
6.2500 mg | INTRAMUSCULAR | Status: DC | PRN
  Administered 2016-10-07: 6.25 mg via INTRAVENOUS

## 2016-10-07 MED ORDER — FENTANYL CITRATE (PF) 100 MCG/2ML IJ SOLN
INTRAMUSCULAR | Status: AC
  Filled 2016-10-07: qty 2

## 2016-10-07 MED ORDER — ONDANSETRON HCL 4 MG/2ML IJ SOLN
INTRAMUSCULAR | Status: DC | PRN
  Administered 2016-10-07: 4 mg via INTRAVENOUS

## 2016-10-07 MED ORDER — HYDROMORPHONE HCL 1 MG/ML IJ SOLN
0.2500 mg | INTRAMUSCULAR | Status: DC | PRN
  Administered 2016-10-07: 0.5 mg via INTRAVENOUS

## 2016-10-07 MED ORDER — FENTANYL CITRATE (PF) 100 MCG/2ML IJ SOLN
INTRAMUSCULAR | Status: DC | PRN
  Administered 2016-10-07 (×3): 50 ug via INTRAVENOUS
  Administered 2016-10-07 (×2): 100 ug via INTRAVENOUS

## 2016-10-07 MED ORDER — MEPERIDINE HCL 50 MG/ML IJ SOLN: 6.2500 mg | INTRAMUSCULAR | Status: DC | PRN

## 2016-10-07 MED ORDER — HYDROMORPHONE HCL 2 MG/ML IJ SOLN
INTRAMUSCULAR | Status: AC
  Filled 2016-10-07: qty 1

## 2016-10-07 MED ORDER — HYDROCODONE-ACETAMINOPHEN 7.5-325 MG PO TABS
1.0000 | ORAL_TABLET | Freq: Once | ORAL | Status: AC | PRN
  Administered 2016-10-07: 1 via ORAL

## 2016-10-07 MED ORDER — LIDOCAINE 2% (20 MG/ML) 5 ML SYRINGE
INTRAMUSCULAR | Status: AC
  Filled 2016-10-07: qty 5

## 2016-10-07 MED ORDER — PROPOFOL 10 MG/ML IV BOLUS
INTRAVENOUS | Status: AC
  Filled 2016-10-07: qty 20

## 2016-10-07 MED ORDER — HYDROCODONE-ACETAMINOPHEN 7.5-325 MG PO TABS
ORAL_TABLET | ORAL | Status: AC
  Filled 2016-10-07: qty 1

## 2016-10-07 MED ORDER — BUPIVACAINE-EPINEPHRINE (PF) 0.5% -1:200000 IJ SOLN
INTRAMUSCULAR | Status: AC
  Filled 2016-10-07: qty 30

## 2016-10-07 MED ORDER — CEFAZOLIN SODIUM-DEXTROSE 2-4 GM/100ML-% IV SOLN
2.0000 g | INTRAVENOUS | Status: AC
  Administered 2016-10-07: 2 g via INTRAVENOUS
  Filled 2016-10-07: qty 100

## 2016-10-07 MED ORDER — HYDROMORPHONE HCL 1 MG/ML IJ SOLN
INTRAMUSCULAR | Status: DC | PRN
  Administered 2016-10-07 (×2): 1 mg via INTRAVENOUS

## 2016-10-07 MED ORDER — PROPOFOL 10 MG/ML IV BOLUS
INTRAVENOUS | Status: DC | PRN
  Administered 2016-10-07: 200 mg via INTRAVENOUS

## 2016-10-07 MED ORDER — PROMETHAZINE HCL 25 MG/ML IJ SOLN
INTRAMUSCULAR | Status: AC
  Filled 2016-10-07: qty 1

## 2016-10-07 MED ORDER — ONDANSETRON HCL 4 MG/2ML IJ SOLN
INTRAMUSCULAR | Status: AC
  Filled 2016-10-07: qty 2

## 2016-10-07 MED ORDER — FENTANYL CITRATE (PF) 250 MCG/5ML IJ SOLN
INTRAMUSCULAR | Status: AC
  Filled 2016-10-07: qty 5

## 2016-10-07 MED ORDER — ROCURONIUM BROMIDE 50 MG/5ML IV SOSY
PREFILLED_SYRINGE | INTRAVENOUS | Status: AC
  Filled 2016-10-07: qty 5

## 2016-10-07 SURGICAL SUPPLY — 33 items
BENZOIN TINCTURE PRP APPL 2/3 (GAUZE/BANDAGES/DRESSINGS) IMPLANT
BNDG GAUZE ELAST 4 BULKY (GAUZE/BANDAGES/DRESSINGS) ×3 IMPLANT
CLOSURE WOUND 1/2 X4 (GAUZE/BANDAGES/DRESSINGS)
COVER SURGICAL LIGHT HANDLE (MISCELLANEOUS) ×3 IMPLANT
DERMABOND ADVANCED (GAUZE/BANDAGES/DRESSINGS) ×2
DERMABOND ADVANCED .7 DNX12 (GAUZE/BANDAGES/DRESSINGS) ×1 IMPLANT
DISSECTOR ROUND CHERRY 3/8 STR (MISCELLANEOUS) ×3 IMPLANT
DRAIN PENROSE 18X1/4 LTX STRL (WOUND CARE) ×3 IMPLANT
DRAPE LAPAROTOMY T 98X78 PEDS (DRAPES) ×3 IMPLANT
ELECT REM PT RETURN 15FT ADLT (MISCELLANEOUS) ×3 IMPLANT
GAUZE SPONGE 4X4 12PLY STRL (GAUZE/BANDAGES/DRESSINGS) IMPLANT
GLOVE BIOGEL M STRL SZ7.5 (GLOVE) ×3 IMPLANT
GOWN STRL REUS W/TWL XL LVL3 (GOWN DISPOSABLE) ×3 IMPLANT
KIT BASIN OR (CUSTOM PROCEDURE TRAY) ×3 IMPLANT
NEEDLE HYPO 22GX1.5 SAFETY (NEEDLE) ×3 IMPLANT
NS IRRIG 1000ML POUR BTL (IV SOLUTION) ×3 IMPLANT
PACK GENERAL/GYN (CUSTOM PROCEDURE TRAY) ×3 IMPLANT
STRIP CLOSURE SKIN 1/2X4 (GAUZE/BANDAGES/DRESSINGS) IMPLANT
SUPPORT SCROTAL LG STRP (MISCELLANEOUS) ×2 IMPLANT
SUPPORTER ATHLETIC LG (MISCELLANEOUS) ×1
SUT MNCRL AB 4-0 PS2 18 (SUTURE) ×3 IMPLANT
SUT SILK 0 (SUTURE) ×2
SUT SILK 0 30XBRD TIE 6 (SUTURE) ×1 IMPLANT
SUT SILK 2 0 (SUTURE) ×2
SUT SILK 2 0 SH CR/8 (SUTURE) ×3 IMPLANT
SUT SILK 2-0 18XBRD TIE 12 (SUTURE) ×1 IMPLANT
SUT SILK 3 0 SH 30 (SUTURE) ×3 IMPLANT
SUT VIC AB 2-0 SH 27 (SUTURE)
SUT VIC AB 2-0 SH 27X BRD (SUTURE) IMPLANT
SUT VIC AB 3-0 SH 27 (SUTURE)
SUT VIC AB 3-0 SH 27XBRD (SUTURE) IMPLANT
SYR CONTROL 10ML LL (SYRINGE) ×3 IMPLANT
TOWEL OR 17X26 10 PK STRL BLUE (TOWEL DISPOSABLE) ×6 IMPLANT

## 2016-10-07 NOTE — Anesthesia Postprocedure Evaluation (Signed)
Anesthesia Post Note  Patient: Robert Peck  Procedure(s) Performed: Procedure(s) (LRB): ORCHIECTOMY RIGHT RADICAL (Right)     Patient location during evaluation: PACU Anesthesia Type: General Level of consciousness: awake and alert and oriented Pain management: pain level controlled Vital Signs Assessment: post-procedure vital signs reviewed and stable Respiratory status: spontaneous breathing, nonlabored ventilation and respiratory function stable Cardiovascular status: blood pressure returned to baseline and stable Postop Assessment: no signs of nausea or vomiting Anesthetic complications: no    Last Vitals:  Vitals:   10/07/16 1445 10/07/16 1458  BP: (!) 157/81 (!) 149/81  Pulse: 95 (!) 106  Resp: 16 16  Temp:  37.1 C    Last Pain:  Vitals:   10/07/16 1458  TempSrc:   PainSc: 9                  Bellamy Rubey A.

## 2016-10-07 NOTE — Op Note (Signed)
Preoperative diagnosis:  1. right testicular mass   Postoperative diagnosis:  1. same   Procedure: 1. right radical orchiectomy  Surgeon: Ardis Hughs, MD Assistant: Dr. Lorayne Bender, MD  Anesthesia: General  Complications: None  Intraoperative findings: firm, enlarged, irregular right testicle  EBL: Minimal  Specimens:  Left testicle and spermatic cord.  Indication:  Robert Peck is a 31 y.o.   patient with palpably abnormal right testicle.  Ultrasound of that testicle suggested a tumor concerning for malignancy.  After reviewing the management options for treatment, he elected to proceed with the above surgical procedure(s). We have discussed the potential benefits and risks of the procedure, side effects of the proposed treatment, the likelihood of the patient achieving the goals of the procedure, and any potential problems that might occur during the procedure or recuperation. Informed consent has been obtained.  Description of procedure:  The patient was taken to the operating room and general anesthesia was induced.  The patient was placed in the supine position, prepped and draped in the usual sterile fashion, and preoperative antibiotics were administered. A preoperative time-out was performed.   A 3 cm incision was made over the right inguinal region dissected down to the external oblique fascia. This was then opened and the ilioinguinal nerve spared. The spermatic cord was then encircled with blunt dissection a Penrose drain was passed around the spermatic cord and used as a tourniquet. Dissection of the spermatic cord distally down into the scrotum then ensued using combination of blunt dissection and electrocautery. The testicle was then passed up through the scrotum and into the field. The gubernaculum was then taken with electrocautery. Once the testicle was free from the scrotum I dissected more proximally to the internal inguinal ring. This point  I suture ligated the artery and the vas deferens separately using silk suture. The specimen was then removed and sent to pathology as left testicle and spermatic cord.  Hemostasis was then meticulously achieved. The external oblique aponeurosis was then reapproximated with 3-0 Vicryl in a running fashion. The wound was then irrigated and hemostasis again ensured. Scarpa's fascia was then closed with a 3-0 Vicryl. The skin was closed with a 4-0 Monocryl.  The patient tolerated the procedure well. At the end of the case all laps and needles and sponges were accounted for. The patient was transferred to the PACU in stable condition.

## 2016-10-07 NOTE — Transfer of Care (Signed)
Immediate Anesthesia Transfer of Care Note  Patient: Robert Peck  Procedure(s) Performed: Procedure(s): ORCHIECTOMY RIGHT RADICAL (Right)  Patient Location: PACU  Anesthesia Type:General  Level of Consciousness:  sedated, patient cooperative and responds to stimulation  Airway & Oxygen Therapy:Patient Spontanous Breathing and Patient connected to face mask oxgen  Post-op Assessment:  Report given to PACU RN and Post -op Vital signs reviewed and stable  Post vital signs:  Reviewed and stable  Last Vitals:  Vitals:   10/07/16 1047 10/07/16 1400  BP: 139/79 (!) 146/84  Pulse: 81 (!) 107  Resp: 16 18  Temp: 34.0 C     Complications: No apparent anesthesia complications

## 2016-10-07 NOTE — Discharge Instructions (Signed)
General Anesthesia, Adult, Care After These instructions provide you with information about caring for yourself after your procedure. Your health care provider may also give you more specific instructions. Your treatment has been planned according to current medical practices, but problems sometimes occur. Call your health care provider if you have any problems or questions after your procedure. What can I expect after the procedure? After the procedure, it is common to have:  Vomiting.  A sore throat.  Mental slowness.  It is common to feel:  Nauseous.  Cold or shivery.  Sleepy.  Tired.  Sore or achy, even in parts of your body where you did not have surgery.  Follow these instructions at home: For at least 24 hours after the procedure:  Do not: ? Participate in activities where you could fall or become injured. ? Drive. ? Use heavy machinery. ? Drink alcohol. ? Take sleeping pills or medicines that cause drowsiness. ? Make important decisions or sign legal documents. ? Take care of children on your own.  Rest. Eating and drinking  If you vomit, drink water, juice, or soup when you can drink without vomiting.  Drink enough fluid to keep your urine clear or pale yellow.  Make sure you have little or no nausea before eating solid foods.  Follow the diet recommended by your health care provider. General instructions  Have a responsible adult stay with you until you are awake and alert.  Return to your normal activities as told by your health care provider. Ask your health care provider what activities are safe for you.  Take over-the-counter and prescription medicines only as told by your health care provider.  If you smoke, do not smoke without supervision.  Keep all follow-up visits as told by your health care provider. This is important. Contact a health care provider if:  You continue to have nausea or vomiting at home, and medicines are not helpful.  You  cannot drink fluids or start eating again.  You cannot urinate after 8-12 hours.  You develop a skin rash.  You have fever.  You have increasing redness at the site of your procedure. Get help right away if:  You have difficulty breathing.  You have chest pain.  You have unexpected bleeding.  You feel that you are having a life-threatening or urgent problem. This information is not intended to replace advice given to you by your health care provider. Make sure you discuss any questions you have with your health care provider. Document Released: 07/19/2000 Document Revised: 09/15/2015 Document Reviewed: 03/27/2015 Elsevier Interactive Patient Education  2018 [employer]      Orchiectomy: POST OP INSTRUCTIONS  1. DIET: Follow a light bland diet the first 24 hours after arrival home, such as soup, liquids, crackers, etc.  Be sure to include lots of fluids daily.  Avoid fast food or heavy meals as your are more likely to get nauseated.  Eat a low fat the next few days after surgery. 2. Take your usually prescribed home medications unless otherwise directed. 3. PAIN CONTROL: a. Pain is best controlled by a usual combination of three different methods TOGETHER: i. Ice/Heat ii. Over the counter pain medication iii. Prescription pain medication b. Most patients will experience some swelling and bruising around the incision.  Ice packs or heating pads (30-60 minutes up to 6 times a day) will help. Use ice for the first few days to help decrease swelling and bruising, then switch to heat to help relax tight/sore spots  and speed recovery.  Some people prefer to use ice alone, heat alone, alternating between ice & heat.  Experiment to what works for you.  Swelling and bruising can take several weeks to resolve.   c. It is helpful to take an over-the-counter pain medication regularly for the first few weeks.  Choose one of the following that works best for you: i. Naproxen (Aleve, etc)   Two 220mg  tabs twice a day ii. Ibuprofen (Advil, etc) Three 200mg  tabs four times a day (every meal & bedtime) iii. Acetaminophen (Tylenol, etc) 325-650mg  four times a day (every meal & bedtime) d. A  prescription for pain medication should be given to you upon discharge.  Take your pain medication as prescribed.  i. If you are having problems/concerns with the prescription medicine (does not control pain, nausea, vomiting, rash, itching, etc), please call us [phone] to see if we need to switch you to a different pain medicine that will work better for you and/or control your side effect better. 4. If you need a refill on your pain medication, please contactus. 5. Avoid getting constipated.  Between the surgery and the pain medications, it is common to experience some constipation.  Increasing fluid intake and taking a fiber supplement (such as Metamucil, Citrucel, FiberCon, MiraLax, etc) 1-2 times a day regularly will usually help prevent this problem from occurring.  A mild laxative (prune juice, Milk of Magnesia, MiraLax, etc) should be taken according to package directions if there are no bowel movements after 48 hours.   6. Wash / shower every day.  You may shower over the dressings as they are waterproof.   7. Remove your waterproof bandages 5 days after surgery.  You may leave the incision open to air.  You may replace a dressing/Band-Aid to cover the incision for comfort if you wish.  Continue to shower over incision(s) after the dressing is off. 8. ACTIVITIES as tolerated:   a. You may resume regular (light) daily activities beginning the next day--such as daily self-care, walking, climbing stairs--gradually increasing activities as tolerated.  If you can walk 30 minutes without difficulty, it is safe to try more intense activity such as jogging, treadmill, bicycling, low-impact aerobics, swimming, etc. b. Save the most intensive and strenuous activity for last such as sit-ups, heavy  lifting, contact sports, etc  Refrain from any heavy lifting or straining until you are off narcotics for pain control.   c. DO NOT PUSH THROUGH PAIN.  Let pain be your guide: If it hurts to do something, don't do it.  Pain is your body warning you to avoid that activity for another week until the pain goes down. d. You may drive when you are no longer taking prescription pain medication, you can comfortably wear a seatbelt, and you can safely maneuver your car and apply brakes. e. Dennis Bast may have sexual intercourse when it is comfortable.  9. FOLLOW UP in our office a. Please call [facility] at [phone] to set up an appointment to see your surgeon in the office for a follow-up appointment approximately 2-3 weeks after your surgery, if you have not been already scheduled for follow-up. b. Make sure that you call for this appointment the day you arrive home to insure a convenient appointment time. 9.  IF YOU HAVE DISABILITY OR FAMILY LEAVE FORMS, BRING THEM TO THE OFFICE FOR PROCESSING.  DO NOT GIVE THEM TO YOUR DOCTOR.  WHEN TO CALL us [phone]: 1. Poor pain control 2.  Reactions / problems with new medications (rash/itching, nausea, etc)  3. Fever over 101.5 F (38.5 C) 4. Inability to urinate 5. Nausea and/or vomiting 6. Worsening swelling or bruising 7. Continued bleeding from incision. 8. Increased pain, redness, or drainage from the incision   The clinic staff is available to answer your questions during regular business hours (8:30am-5pm).  Please dont hesitate to call and ask to speak to one of our nurses for clinical concerns.   If you have a medical emergency, go to the nearest emergency room or call 911.

## 2016-10-07 NOTE — Interval H&P Note (Signed)
History and Physical Interval Note:  10/07/2016 11:15 AM  Robert Peck  has presented today for surgery, with the diagnosis of RIGHT TESTICULAR MASS  The various methods of treatment have been discussed with the patient and family. After consideration of risks, benefits and other options for treatment, the patient has consented to  Procedure(s): ORCHIECTOMY RIGHT RADICAL (Right) as a surgical intervention .  The patient's history has been reviewed, patient examined, no change in status, stable for surgery.  I have reviewed the patient's chart and labs.  Questions were answered to the patient's satisfaction.     Louis Meckel W

## 2016-10-07 NOTE — H&P (View-Only) (Signed)
I have been asked to see the patient by Dr. Lajean Saver, for evaluation and management of right testicular pain.  History of present illness: 31 year old otherwise healthy male presented to the emergency department tonight with right-sided testicular pain. The patient states that the pain has been intermittent for the past month and a half. However, overnight he had excruciating pain is unable to sleep. Exam in the emergency room demonstrating an abnormally large and firm irregular right testicle. An ultrasound wasn't performed demonstrating 5 cm heterogeneous testicular mass with necrosis. The patient's pain is at the posterior aspect of the testicle and radiates up into the inguinal canal. It is tender to palpation. The patient has not tried any additional medications for.  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 He also has a left inguinal hernia repair at the same time.  Review of systems: A 12 point comprehensive review of systems was obtained and is negative unless otherwise stated in the history of present illness.  There are no active problems to display for this patient.   No current facility-administered medications on file prior to encounter.    Current Outpatient Prescriptions on File Prior to Encounter  Medication Sig Dispense Refill  . amoxicillin (AMOXIL) 500 MG capsule Take 1 capsule (500 mg total) by mouth 3 (three) times daily. (Patient not taking: Reported on 10/04/2016) 21 capsule 0  . bisacodyl (DULCOLAX) 5 MG EC tablet Take 1 tablet (5 mg total) by mouth daily as needed for moderate constipation. (Patient not taking: Reported on 10/04/2016) 30 tablet 0  . ibuprofen (ADVIL,MOTRIN) 800 MG tablet Take 1 tablet (800 mg total) by mouth 3 (three) times daily. (Patient not taking: Reported on 10/04/2016) 21 tablet 0  . oxyCODONE-acetaminophen (PERCOCET/ROXICET) 5-325 MG tablet Take 1-2 tablets by mouth every 4 (four) hours as needed  for severe pain. (Patient not taking: Reported on 10/04/2016) 20 tablet 0  . sulfamethoxazole-trimethoprim (BACTRIM DS,SEPTRA DS) 800-160 MG per tablet Take 1 tablet by mouth 2 (two) times daily. (Patient not taking: Reported on 10/04/2016) 14 tablet 0  . traMADol (ULTRAM) 50 MG tablet Take 1 tablet (50 mg total) by mouth every 6 (six) hours as needed. (Patient not taking: Reported on 10/04/2016) 15 tablet 0    Past Medical History:  Diagnosis Date  . Asthma   . Flu 12/14  . Hernia   . Kidney stones   . Pneumonia 12/13    Past Surgical History:  Procedure Laterality Date  . HERNIA REPAIR      Social History  Substance Use Topics  . Smoking status: Current Every Day Smoker    Packs/day: 0.50    Types: Cigarettes  . Smokeless tobacco: Never Used  . Alcohol use Yes     Comment: occ    Family History  Problem Relation Age of Onset  . Diabetes Mother     PE: Vitals:   10/04/16 1249 10/04/16 1642 10/04/16 1929  BP: 136/86 117/66 117/71  Pulse: 98 64 63  Resp: 17 18 16   Temp: 98.9 F (37.2 C)  98.3 F (36.8 C)  TempSrc: Oral  Oral  SpO2: 99% 98% 99%  Weight: 91.6 kg (202 lb)    Height: 6\' 2"  (1.88 m)     Patient appears to be in no acute distress  patient is alert and oriented x3 Atraumatic normocephalic head No cervical or supraclavicular lymphadenopathy appreciated No increased work of breathing, no audible wheezes/rhonchi Regular sinus rhythm/rate Abdomen  is soft, nontender, nondistended, no CVA or suprapubic tenderness Patient's right hemiscrotum is enlarged with a large irregular testicular mass. He has tenderness the posterior aspect which is likely the epididymis although the anatomy is quite distorted. He has tenderness in the inguinal canal over the spermatic cord. Lower extremities are symmetric without appreciable edema Grossly neurologically intact No identifiable skin lesions  No results for input(s): WBC, HGB, HCT in the last 72 hours. No results for  input(s): NA, K, CL, CO2, GLUCOSE, BUN, CREATININE, CALCIUM in the last 72 hours. No results for input(s): LABPT, INR in the last 72 hours. No results for input(s): LABURIN in the last 72 hours. Results for orders placed or performed during the hospital encounter of 05/30/12  Rapid strep screen     Status: None   Collection Time: 05/30/12  3:55 PM  Result Value Ref Range Status   Streptococcus, Group A Screen (Direct) NEGATIVE NEGATIVE Final    Comment:        DUE TO INADEQUATE SENSITIVITY OF EIA RAPID TESTS FOR GROUP A STREP (GAS) IT IS RECOMMENDED THAT ALL NEGATIVE RESULTS BE FOLLOWED BY A GROUP A STREP PROBE.    Imaging: I've independently reviewed the patient's testicular ultrasound which demonstrates a very irregular 7.2 times 4.3 times 5.7 centimeter heterogeneous mass in the right testicle with a necrotic center. There is little normal tissue in the right testicle. The patient's left testicle appears to be otherwise normal.  Imp: The patient has a very large right irregular testicular mass which is concerning for testicular cancer.  Recommendations: I spoke to the patient about my concerns for his right testicle and his risk for cancer. I recommended that we proceed with a right radical orchiectomy in the near future. I explained to the patient the surgery as well as recovery. We discussed the risks and benefits thereof.  In addition to surgery, the patient had tumor markers that were drawn today in the emergency department as part of his evaluation. We will follow these up and use these as part of our tumor staging if he doesn't fact have cancer.  I candidly spoke to the patient about testicular cancer, I reassured him that this is a very treatable disease, and that with close follow-up, his survival rate would be very good.  Louis Meckel W

## 2016-10-07 NOTE — Anesthesia Preprocedure Evaluation (Addendum)
Anesthesia Evaluation  Patient identified by MRN, date of birth, ID band Patient awake    Reviewed: Allergy & Precautions, NPO status , Patient's Chart, lab work & pertinent test results  Airway Mallampati: II  TM Distance: >3 FB Neck ROM: Full    Dental no notable dental hx. (+) Teeth Intact, Chipped,    Pulmonary asthma , pneumonia, resolved, Current Smoker,    Pulmonary exam normal breath sounds clear to auscultation       Cardiovascular negative cardio ROS Normal cardiovascular exam Rhythm:Regular Rate:Normal     Neuro/Psych negative neurological ROS  negative psych ROS   GI/Hepatic negative GI ROS, Neg liver ROS, (+)     substance abuse  marijuana use,   Endo/Other    Renal/GU Renal diseaseHx/o renal calculi  negative genitourinary   Musculoskeletal negative musculoskeletal ROS (+)   Abdominal   Peds  Hematology   Anesthesia Other Findings   Reproductive/Obstetrics Right testicular mass                            Anesthesia Physical Anesthesia Plan  ASA: II  Anesthesia Plan: General   Post-op Pain Management:    Induction: Intravenous  PONV Risk Score and Plan: 3 and Ondansetron, Dexamethasone, Propofol and Midazolam  Airway Management Planned: LMA  Additional Equipment:   Intra-op Plan:   Post-operative Plan: Extubation in OR  Informed Consent: I have reviewed the patients History and Physical, chart, labs and discussed the procedure including the risks, benefits and alternatives for the proposed anesthesia with the patient or authorized representative who has indicated his/her understanding and acceptance.   Dental advisory given  Plan Discussed with: CRNA, Anesthesiologist and Surgeon  Anesthesia Plan Comments:         Anesthesia Quick Evaluation

## 2016-10-07 NOTE — Anesthesia Procedure Notes (Signed)
Procedure Name: LMA Insertion Date/Time: 10/07/2016 12:39 PM Performed by: Maxwell Caul Pre-anesthesia Checklist: Patient identified, Emergency Drugs available, Suction available and Patient being monitored Patient Re-evaluated:Patient Re-evaluated prior to inductionOxygen Delivery Method: Circle system utilized Preoxygenation: Pre-oxygenation with 100% oxygen Intubation Type: IV induction LMA: LMA inserted LMA Size: 5.0 Number of attempts: 1 Placement Confirmation: positive ETCO2 and breath sounds checked- equal and bilateral Tube secured with: Tape Dental Injury: Teeth and Oropharynx as per pre-operative assessment

## 2016-10-08 ENCOUNTER — Encounter (HOSPITAL_COMMUNITY): Payer: Self-pay | Admitting: Urology

## 2016-10-18 ENCOUNTER — Other Ambulatory Visit: Payer: Self-pay | Admitting: Urology

## 2016-10-18 DIAGNOSIS — Z8547 Personal history of malignant neoplasm of testis: Secondary | ICD-10-CM

## 2016-10-20 ENCOUNTER — Ambulatory Visit (HOSPITAL_COMMUNITY): Payer: No Typology Code available for payment source

## 2016-10-28 ENCOUNTER — Ambulatory Visit (HOSPITAL_COMMUNITY)
Admission: RE | Admit: 2016-10-28 | Discharge: 2016-10-28 | Disposition: A | Discharge status: Home / Self Care | Payer: Self-pay | Source: Ambulatory Visit | Attending: Urology | Admitting: Urology

## 2016-10-28 ENCOUNTER — Other Ambulatory Visit: Payer: Self-pay | Admitting: Urology

## 2016-10-28 DIAGNOSIS — C6200 Malignant neoplasm of unspecified undescended testis: Secondary | ICD-10-CM

## 2016-10-28 DIAGNOSIS — Z8547 Personal history of malignant neoplasm of testis: Secondary | ICD-10-CM

## 2016-10-28 DIAGNOSIS — R918 Other nonspecific abnormal finding of lung field: Secondary | ICD-10-CM | POA: Insufficient documentation

## 2016-10-28 DIAGNOSIS — C6290 Malignant neoplasm of unspecified testis, unspecified whether descended or undescended: Secondary | ICD-10-CM

## 2016-10-28 DIAGNOSIS — Z9079 Acquired absence of other genital organ(s): Secondary | ICD-10-CM | POA: Insufficient documentation

## 2016-10-28 DIAGNOSIS — R590 Localized enlarged lymph nodes: Secondary | ICD-10-CM | POA: Insufficient documentation

## 2016-10-28 MED ORDER — IOPAMIDOL (ISOVUE-300) INJECTION 61%
INTRAVENOUS | Status: AC
Start: 2016-10-28 — End: 2016-10-28
  Administered 2016-10-28: 100 mL via INTRAVENOUS
  Filled 2016-10-28: qty 100

## 2016-11-10 ENCOUNTER — Emergency Department (HOSPITAL_COMMUNITY)
Admission: EM | Admit: 2016-11-10 | Discharge: 2016-11-10 | Disposition: A | Discharge status: Home / Self Care | Payer: Self-pay | Attending: Emergency Medicine | Admitting: Emergency Medicine

## 2016-11-10 ENCOUNTER — Emergency Department (HOSPITAL_COMMUNITY): Payer: Self-pay

## 2016-11-10 ENCOUNTER — Encounter (HOSPITAL_COMMUNITY): Payer: Self-pay | Admitting: Emergency Medicine

## 2016-11-10 DIAGNOSIS — Z85118 Personal history of other malignant neoplasm of bronchus and lung: Secondary | ICD-10-CM | POA: Insufficient documentation

## 2016-11-10 DIAGNOSIS — R590 Localized enlarged lymph nodes: Secondary | ICD-10-CM | POA: Insufficient documentation

## 2016-11-10 DIAGNOSIS — R109 Unspecified abdominal pain: Secondary | ICD-10-CM

## 2016-11-10 DIAGNOSIS — J45909 Unspecified asthma, uncomplicated: Secondary | ICD-10-CM | POA: Insufficient documentation

## 2016-11-10 DIAGNOSIS — M545 Low back pain: Secondary | ICD-10-CM | POA: Insufficient documentation

## 2016-11-10 DIAGNOSIS — F17210 Nicotine dependence, cigarettes, uncomplicated: Secondary | ICD-10-CM | POA: Insufficient documentation

## 2016-11-10 HISTORY — DX: Malignant (primary) neoplasm, unspecified: C80.1

## 2016-11-10 LAB — URINALYSIS, ROUTINE W REFLEX MICROSCOPIC
BACTERIA UA: NONE SEEN
Bilirubin Urine: NEGATIVE
Glucose, UA: NEGATIVE mg/dL
KETONES UR: NEGATIVE mg/dL
Leukocytes, UA: NEGATIVE
Nitrite: NEGATIVE
PROTEIN: NEGATIVE mg/dL
Specific Gravity, Urine: 1.011 (ref 1.005–1.030)
Squamous Epithelial / LPF: NONE SEEN
pH: 7 (ref 5.0–8.0)

## 2016-11-10 LAB — CBC
HCT: 41.6 % (ref 39.0–52.0)
Hemoglobin: 14.6 g/dL (ref 13.0–17.0)
MCH: 31.7 pg (ref 26.0–34.0)
MCHC: 35.1 g/dL (ref 30.0–36.0)
MCV: 90.4 fL (ref 78.0–100.0)
PLATELETS: 275 10*3/uL (ref 150–400)
RBC: 4.6 MIL/uL (ref 4.22–5.81)
RDW: 12.3 % (ref 11.5–15.5)
WBC: 21.2 10*3/uL — ABNORMAL HIGH (ref 4.0–10.5)

## 2016-11-10 LAB — COMPREHENSIVE METABOLIC PANEL
ALBUMIN: 4 g/dL (ref 3.5–5.0)
ALK PHOS: 71 U/L (ref 38–126)
ALT: 81 U/L — AB (ref 17–63)
AST: 50 U/L — AB (ref 15–41)
Anion gap: 10 (ref 5–15)
BUN: 15 mg/dL (ref 6–20)
CALCIUM: 9.2 mg/dL (ref 8.9–10.3)
CO2: 29 mmol/L (ref 22–32)
CREATININE: 1.13 mg/dL (ref 0.61–1.24)
Chloride: 100 mmol/L — ABNORMAL LOW (ref 101–111)
GFR calc non Af Amer: >60 mL/min (ref >60)
GLUCOSE: 101 mg/dL — AB (ref 65–99)
Potassium: 3.6 mmol/L (ref 3.5–5.1)
SODIUM: 139 mmol/L (ref 135–145)
Total Bilirubin: 0.5 mg/dL (ref 0.3–1.2)
Total Protein: 7.4 g/dL (ref 6.5–8.1)

## 2016-11-10 LAB — LIPASE, BLOOD: LIPASE: 33 U/L (ref 11–51)

## 2016-11-10 MED ORDER — IOPAMIDOL (ISOVUE-300) INJECTION 61%
INTRAVENOUS | Status: AC
  Administered 2016-11-10: 100 mL
  Filled 2016-11-10: qty 100

## 2016-11-10 MED ORDER — SODIUM CHLORIDE 0.9 % IV BOLUS (SEPSIS)
1000.0000 mL | Freq: Once | INTRAVENOUS | Status: AC
  Administered 2016-11-10: 1000 mL via INTRAVENOUS

## 2016-11-10 MED ORDER — ONDANSETRON HCL 4 MG/2ML IJ SOLN
4.0000 mg | Freq: Once | INTRAMUSCULAR | Status: AC
  Administered 2016-11-10: 4 mg via INTRAVENOUS
  Filled 2016-11-10: qty 2

## 2016-11-10 MED ORDER — MORPHINE SULFATE (PF) 4 MG/ML IV SOLN
4.0000 mg | Freq: Once | INTRAVENOUS | Status: AC
  Administered 2016-11-10: 4 mg via INTRAVENOUS
  Filled 2016-11-10: qty 1

## 2016-11-10 MED ORDER — ONDANSETRON HCL 4 MG/2ML IJ SOLN
4.0000 mg | Freq: Once | INTRAMUSCULAR | Status: AC | PRN
  Administered 2016-11-10: 4 mg via INTRAVENOUS
  Filled 2016-11-10: qty 2

## 2016-11-10 MED ORDER — ONDANSETRON HCL 4 MG/2ML IJ SOLN: 4.0000 mg | Freq: Once | INTRAMUSCULAR | Status: DC

## 2016-11-10 MED ORDER — KETAMINE HCL-SODIUM CHLORIDE 100-0.9 MG/10ML-% IV SOSY
0.1000 mg/kg | PREFILLED_SYRINGE | Freq: Once | INTRAVENOUS | Status: AC
  Administered 2016-11-10: 9.9 mg via INTRAVENOUS
  Filled 2016-11-10: qty 10

## 2016-11-10 MED ORDER — HYDROMORPHONE HCL 1 MG/ML IJ SOLN
1.0000 mg | Freq: Once | INTRAMUSCULAR | Status: AC
  Administered 2016-11-10: 1 mg via INTRAVENOUS
  Filled 2016-11-10: qty 1

## 2016-11-10 NOTE — ED Provider Notes (Signed)
[facility] DEPT Provider Note   CSN: [id number] Arrival date & time: 11/10/16  1511     History   Chief Complaint Chief Complaint  Patient presents with  . Flank Pain  . Post-op Problem    HPI Robert Peck is a 31 y.o. male.  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 Today reporting a lot of pain in his back.  Also had hx of elevated Cr.  Has bilateral lower back pain, started about 3hours ago. Nonstop pain.  Sharp pain. Bilateral flank/low back. No radiation.  Had small bowel movement today, very small.  Has had back pain since prior surgery in June.  "Same exact thing" as back pain has had previously but reports is more severe. No numbness, weakness, no loss of control of bowel or bladder. No significant abdominal pain but reports some feeling of being distended, uncomfortable. No fevers, no dysuria.   Past Medical History:  Diagnosis Date  . Asthma     mild, no current inhaler use  . Cancer ([place])    lung   . Hernia   . Kidney stones   . Pneumonia 12/13  . Testicular mass    right    There are no active problems to display for this patient.   Past Surgical History:  Procedure Laterality Date  . BRAIN SURGERY    . HERNIA REPAIR  age 54  . ORCHIECTOMY Right 10/07/2016   Procedure: ORCHIECTOMY RIGHT RADICAL;  Surgeon: Ardis Hughs, MD;  Location: [facility];  Service: Urology;  Laterality: Right;  . surgery for undescended testicle  age 41       Home Medications    Prior to Admission medications   Medication Sig Start Date End Date Taking? Authorizing Provider  acetaminophen (TYLENOL) 500 MG tablet Take 1,000 mg by mouth every 6 (six) hours as needed for moderate pain.   Yes [provider]  cholecalciferol (VITAMIN D) 1000 units tablet Take 1,000 Units by mouth daily.   Yes [provider]  dexamethasone (DECADRON) 2 MG tablet Take 2 mg by mouth as  directed. Take 2mg  by mouth every 6 hours x 3 days, take 2mg  by mouth every 8 hours x 3 days, take 2mg  by mouth every 12 hours x 3 days, take 2mg  once daily x 3 days. 11/07/16  Yes [provider]  famotidine (PEPCID) 20 MG tablet Take 20 mg by mouth 2 (two) times daily.   Yes [provider]  Lacosamide (VIMPAT) 150 MG TABS Take 150 mg by mouth 2 (two) times daily.   Yes [provider]  Oxycodone HCl 10 MG TABS Take 10 mg by mouth every 6 (six) hours.   Yes [provider]  senna-docusate (SENOKOT-S) 8.6-50 MG tablet Take 1 tablet by mouth 2 (two) times daily.   Yes [provider]    Family History Family History  Problem Relation Age of Onset  . Diabetes Mother     Social History Social History  Substance Use Topics  . Smoking status: Current Every Day Smoker    Packs/day: 0.25    Years: 14.00    Types: Cigarettes  . Smokeless tobacco: Never Used  . Alcohol use Yes     Comment: occ     Allergies   Patient has no known allergies.   Review of Systems Review of Systems  Constitutional: Negative for fever.  HENT: Negative for  sore throat.   Eyes: Negative for visual disturbance.  Respiratory: Negative for cough and shortness of breath.   Cardiovascular: Negative for chest pain.  Gastrointestinal: Positive for abdominal distention, constipation and nausea. Negative for abdominal pain, diarrhea and vomiting.  Genitourinary: Negative for difficulty urinating and dysuria.  Musculoskeletal: Positive for back pain. Negative for neck stiffness.  Skin: Negative for rash.  Neurological: Positive for light-headedness. Negative for syncope, weakness, numbness and headaches.     Physical Exam Updated Vital Signs BP (!) 139/93   Pulse (!) 58   Temp 98.5 F (36.9 C) (Oral)   Resp 14   Wt 99 kg (218 lb 4.8 oz)   SpO2 100%   BMI 28.03 kg/m   Physical Exam  Constitutional: He is oriented to person, place, and time. He appears  well-developed and well-nourished. No distress.  HENT:  Head: Normocephalic and atraumatic.  Eyes: Conjunctivae and EOM are normal.  Neck: Normal range of motion.  Cardiovascular: Normal rate, regular rhythm, normal heart sounds and intact distal pulses.  Exam reveals no gallop and no friction rub.   No murmur heard. Pulmonary/Chest: Effort normal and breath sounds normal. No respiratory distress. He has no wheezes. He has no rales.  Abdominal: Soft. He exhibits no distension. There is tenderness (LLQ). There is no guarding.  Bilateral flank tenderness, lower back tenderness  Musculoskeletal: He exhibits no edema.  Neurological: He is alert and oriented to person, place, and time.  Skin: Skin is warm and dry. He is not diaphoretic.  Nursing note and vitals reviewed.    ED Treatments / Results  Labs (all labs ordered are listed, but only abnormal results are displayed) Labs Reviewed  URINALYSIS, ROUTINE W REFLEX MICROSCOPIC - Abnormal; Notable for the following:       Result Value   Color, Urine STRAW (*)    Hgb urine dipstick SMALL (*)    All other components within normal limits  COMPREHENSIVE METABOLIC PANEL - Abnormal; Notable for the following:    Chloride 100 (*)    Glucose, Bld 101 (*)    AST 50 (*)    ALT 81 (*)    All other components within normal limits  CBC - Abnormal; Notable for the following:    WBC 21.2 (*)    All other components within normal limits  LIPASE, BLOOD    EKG  EKG Interpretation None       Radiology Ct Abdomen Pelvis W Contrast  Result Date: 11/10/2016 CLINICAL DATA:  Initial evaluation for acute left lower quadrant tenderness, flank pain. History of testicular carcinoma. EXAM: CT ABDOMEN AND PELVIS WITH CONTRAST TECHNIQUE: Multidetector CT imaging of the abdomen and pelvis was performed using the standard protocol following bolus administration of intravenous contrast. CONTRAST:  151mL ISOVUE-300 IOPAMIDOL (ISOVUE-300) INJECTION 61%  COMPARISON:  Prior CT from 10/28/2016. FINDINGS: Lower chest: Multiple pulmonary nodules seen within the lung bases, increased in size as compared to recent exam, consistent with progressive disease. For reference purposes, largest nodule at the right lung base measures 2.5 cm (series 6, image 9, previously 2.3 cm). Hepatobiliary: Liver demonstrates a normal contrast enhanced appearance. Gallbladder within normal limits. No biliary dilatation. Pancreas: Pancreas within normal limits. Spleen: Spleen within normal limits. Adrenals/Urinary Tract: Adrenal glands are normal. Kidneys equal in size with symmetric enhancement. No nephrolithiasis, hydronephrosis, or focal enhancing renal mass. Subcentimeter hypodensity within the interpolar left kidney noted, too small the characterize, but statistically likely reflects a small cyst. No hydroureter. Bladder within normal limits.  Stomach/Bowel: Stomach within normal limits. No evidence for bowel obstruction. Appendix is normal. Descending and sigmoid colon are largely decompressed. Mild circumferential wall thickening favored to be related incomplete distension, although possible mild colitis could be considered in the correct clinical setting. No other acute inflammatory changes about the bowels. Vascular/Lymphatic: Normal intravascular enhancement seen throughout the intra-abdominal aorta and its branch vessels. Aortocaval nodal conglomerate again seen, measuring similar at 3.3 x 4.1 cm (series 2, image 36), previously 3.2 x 4.2 cm). This is not significantly changed. Additional nodal conglomerate inferiorly all slow relatively similar measuring approximately 2.8 x 2.0 cm, not significantly changed when measured at similar levels. Reproductive: Prostate within normal limits. Other: Trace free fluid within the pelvis, which may be reactive. No free intraperitoneal air. Postsurgical changes noted within the right groin, most likely related to prior orchiectomy.  Musculoskeletal: No acute osseus abnormality. No worrisome lytic or blastic osseous lesions. IMPRESSION: 1. Diffuse circumferential wall thickening about the descending and sigmoid colon without associated inflammatory stranding, most likely related to incomplete distension, although possible superimposed colitis could be considered in the correct clinical setting. 2. Small volume free fluid within the pelvis, which may be reactive. 3. Increased size of multiple pulmonary nodules within the visualized lung bases, consistent with progressive metastatic disease. 4. No significant interval change in retroperitoneal necrotic adenopathy, consistent with metastatic disease. Electronically Signed   By: Jeannine Boga M.D.   On: 11/10/2016 18:44    Procedures Procedures (including critical care time)  Medications Ordered in ED Medications  ondansetron (ZOFRAN) injection 4 mg (4 mg Intravenous Given 11/10/16 1644)  morphine 4 MG/ML injection 4 mg (4 mg Intravenous Given 11/10/16 1644)  HYDROmorphone (DILAUDID) injection 1 mg (1 mg Intravenous Given 11/10/16 1723)  sodium chloride 0.9 % bolus 1,000 mL (0 mLs Intravenous Stopped 11/10/16 1905)  ondansetron (ZOFRAN) injection 4 mg (4 mg Intravenous Given 11/10/16 1723)  iopamidol (ISOVUE-300) 61 % injection (100 mLs  Contrast Given 11/10/16 1752)  HYDROmorphone (DILAUDID) injection 1 mg (1 mg Intravenous Given 11/10/16 1906)  ketamine 100 mg in normal saline 10 mL (10mg /mL) syringe (9.9 mg Intravenous Given 11/10/16 2014)  morphine 4 MG/ML injection 4 mg (4 mg Intravenous Given 11/10/16 2123)     Initial Impression / Assessment and Plan / ED Course  I have reviewed the triage vital signs and the nursing notes.  Pertinent labs & imaging results that were available during my care of the patient were reviewed by me and considered in my medical decision making (see chart for details).     31yo male with history of metastatic testicular cancer, recent  craniotomy 7/13 presents with concern for lower back/flank pain. No numbness/weakness/loss of control of bowel or bladder.  CT abdomen/pelvis shows ?colitis, and continuing retroperitoneal lymphadenopathy.  Pt without diarrhea, doubt significant colitis/bacterial colitis. Back pain likely secondary to metastatic cancer, necrotic retroperitoneal lymph nodes.  Recommend Oncology follow up, consideration of outpt MR, however feel lymphadenopathy may be etiology of pain. Given morphine, dilaudid, low dose ketamine with improvement. Recommend discussing pain control with outpt providers. Patient discharged in stable condition with understanding of reasons to return.   Final Clinical Impressions(s) / ED Diagnoses   Final diagnoses:  Flank pain  Retroperitoneal lymphadenopathy, necrotic lymph nodes    New Prescriptions Discharge Medication List as of 11/10/2016  9:19 PM       Gareth Morgan, MD 11/11/16 5974

## 2016-11-10 NOTE — ED Triage Notes (Signed)
Pt from home with family. Pt had a brain tumor removed on 7/13. 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 Pt's family report pt had increase in creatinine level. Pt states about 2 hours ago he began having intense lower bilateral pain. Pt also has increased symptom of nausea. Pt denies fever or chills at home.

## 2016-11-10 NOTE — ED Notes (Signed)
Writer attempted to draw blood, unsuccessful

## 2017-06-17 IMAGING — US US SCROTUM
1 series · 13 of 25 positions shown · non-contrast
Comparison: CT abdomen pelvis 12/11/2013

CLINICAL DATA: Patient with right testicular pain and swelling for
1 month.

EXAM:
SCROTAL ULTRASOUND
DOPPLER ULTRASOUND OF THE TESTICLES
TECHNIQUE: Complete ultrasound examination of the testicles, epididymis, and
other scrotal structures was performed. Color and spectral Doppler
ultrasound were also utilized to evaluate blood flow to the
testicles.

[Series 1: us scrotum · 0.07mm/px · 104 acquisitions, 13 frames shown]
[im 1/104]
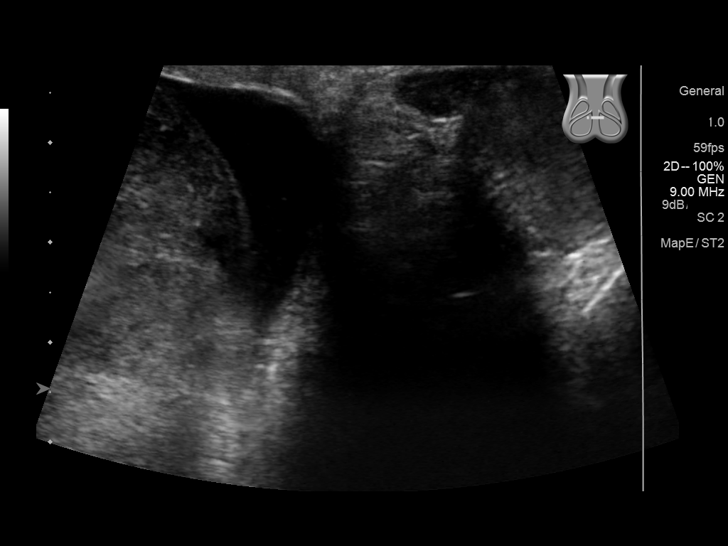
[im 9/104]
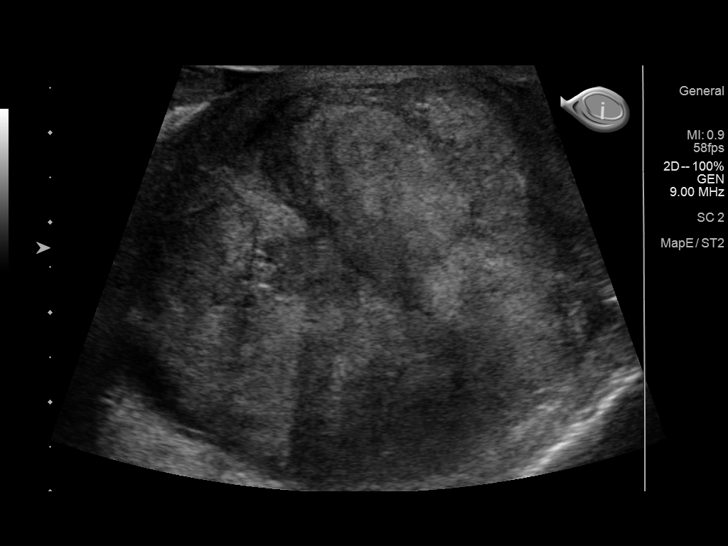
[im 18/104]
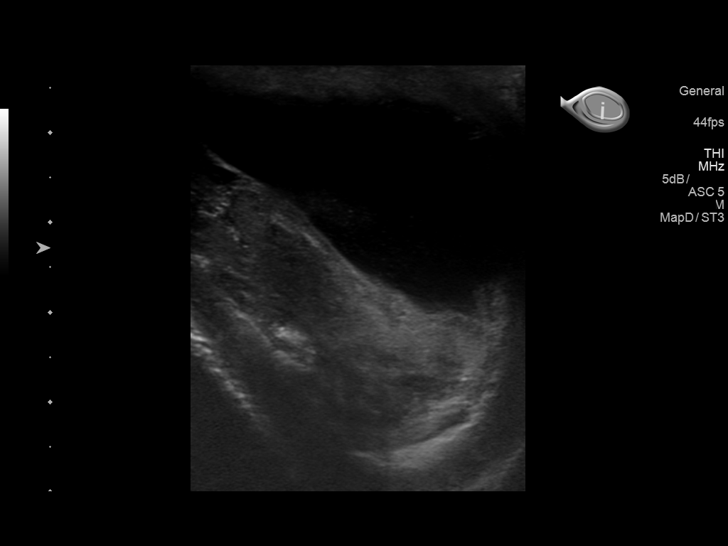
[im 26/104]
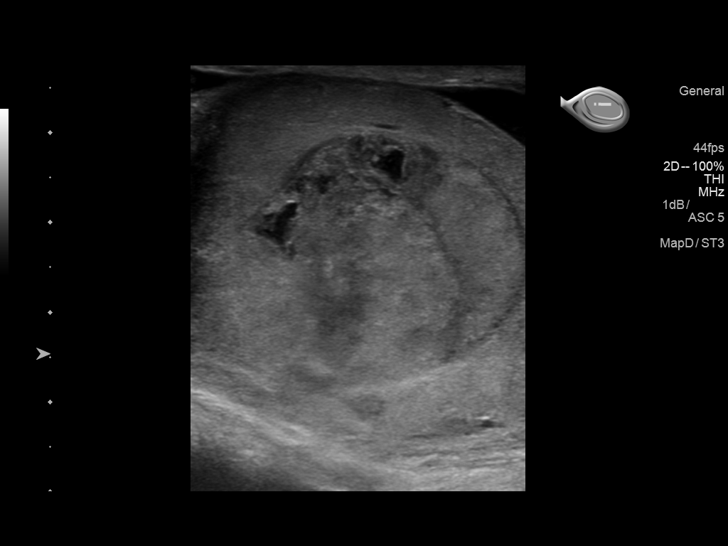
[im 35/104]
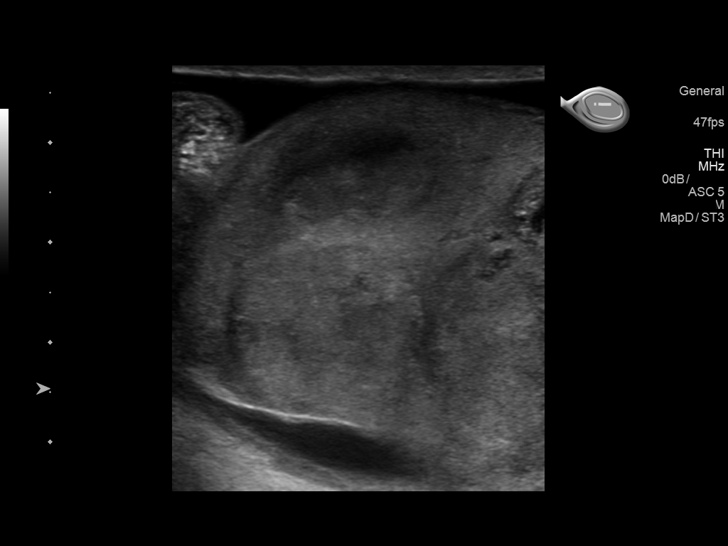
[im 43/104]
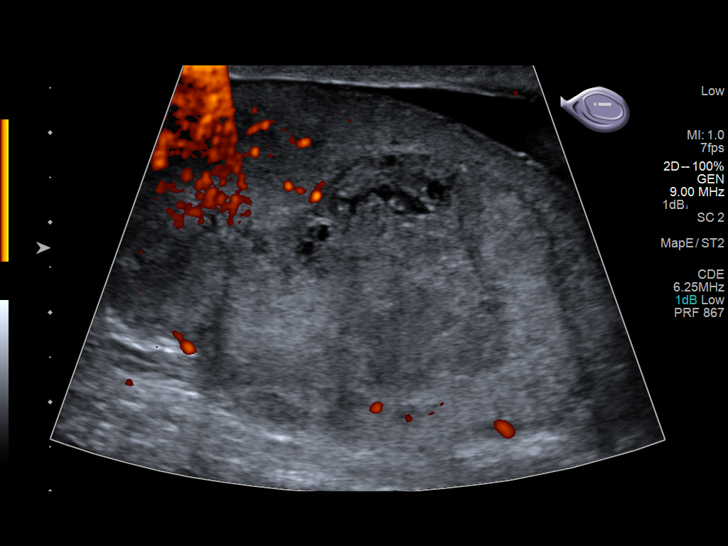
[im 52/104]
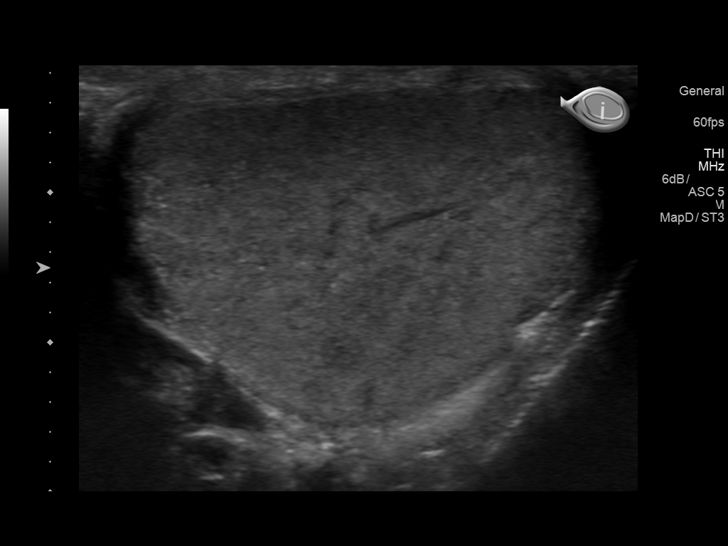
[im 61/104]
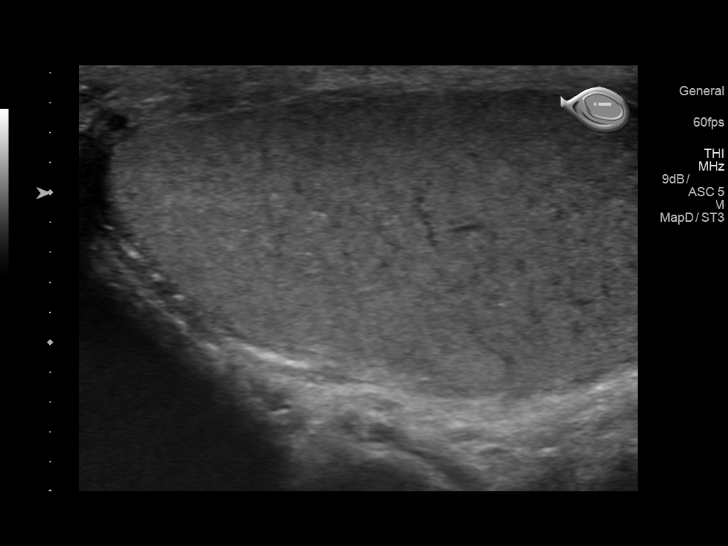
[im 69/104]
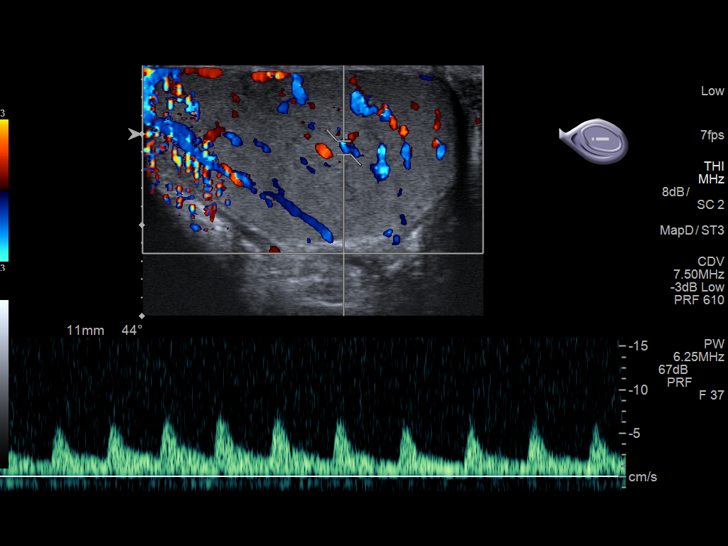
[im 78/104]
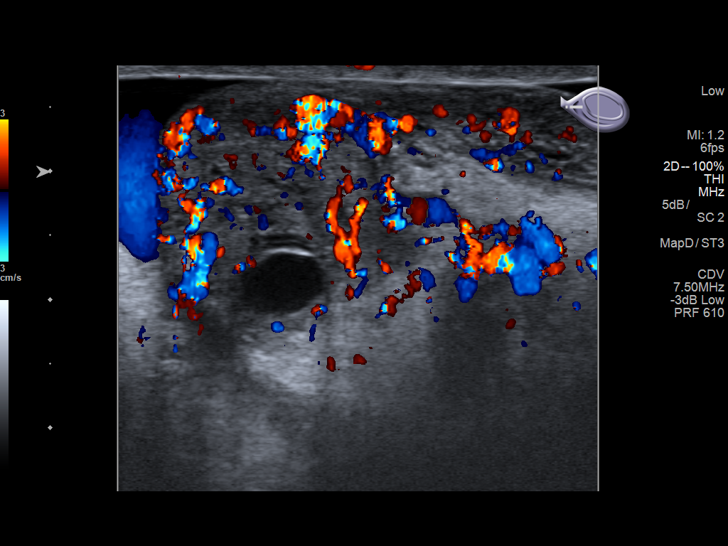
[im 86/104]
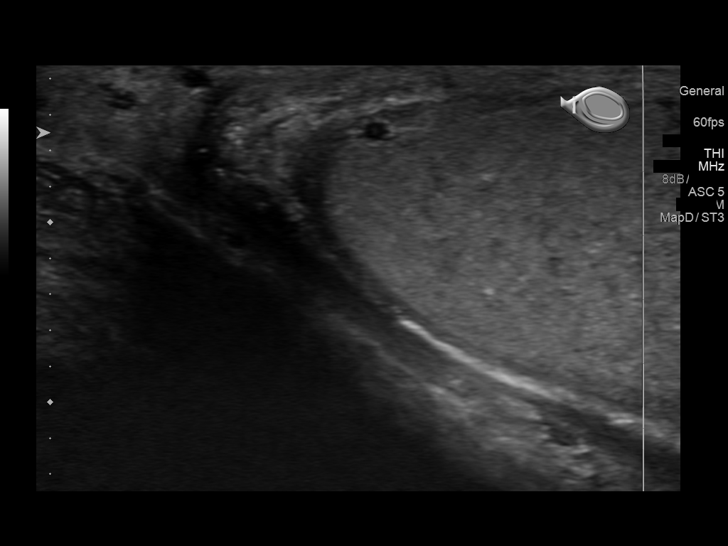
[im 95/104]
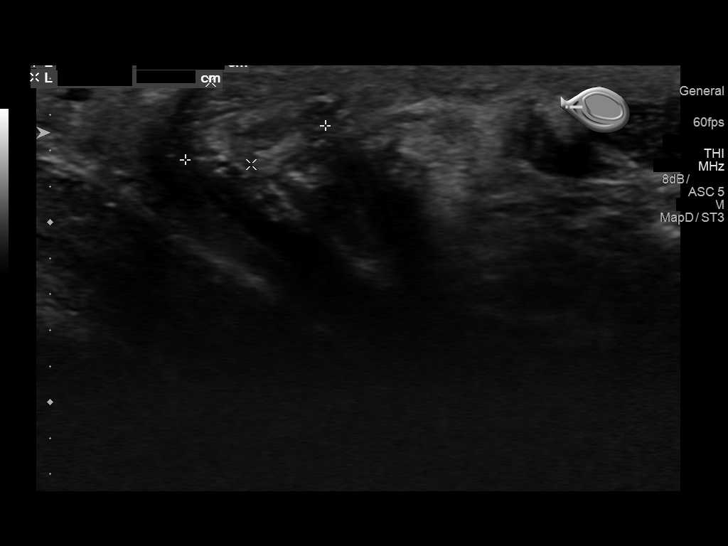
[im 104/104]
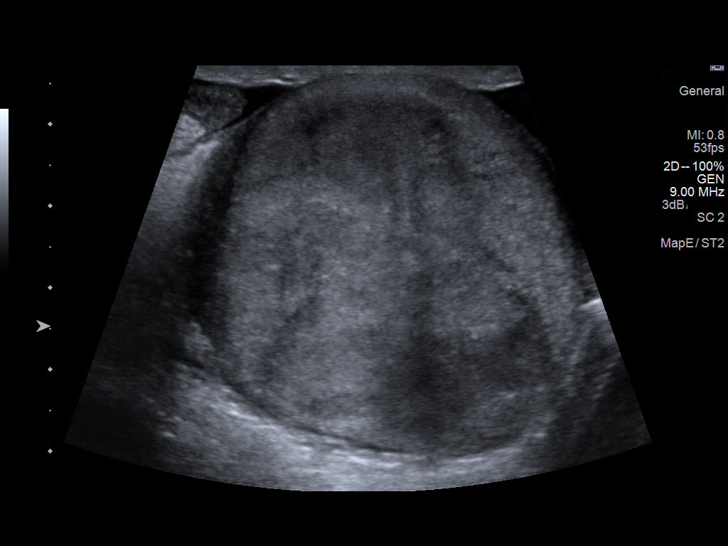

[13 of 25 positions shown; findings below may reference images not displayed]

FINDINGS: Right testicle

Measurements: 7.2 x 4.3 x 5.7 cm. There is a large heterogeneous
mixed echogenicity mass infiltrative throughout the right testicle
measuring up to 3.8 x 2.9 cm.

Left testicle

Measurements: 4.3 x 2.5 x 3.1 cm. No mass or microlithiasis
visualized.

Right epididymis: Enlarged and hyperemic. Small epididymal head cyst
versus spermatocele.

Left epididymis: Two small cysts measuring up to 9 mm within the
epididymal tail.

Hydrocele:  Small right hydrocele.

Varicocele:  None visualized.

Pulsed Doppler interrogation of both testes demonstrates normal low
resistance arterial and venous waveforms bilaterally.
IMPRESSION: Large infiltrating mixed echogenicity mass involving the majority of
the right testicle concerning for primary testicular neoplasm.

These results were called by telephone at the time of interpretation
on 10/04/2016 at [DATE] to Dr. Aujla, who verbally acknowledged
these results.

## 2017-11-24 ENCOUNTER — Encounter (HOSPITAL_COMMUNITY): Payer: Self-pay | Admitting: Emergency Medicine

## 2017-11-24 ENCOUNTER — Emergency Department (HOSPITAL_COMMUNITY)
Admission: EM | Admit: 2017-11-24 | Discharge: 2017-11-24 | Disposition: A | Discharge status: Home / Self Care | Payer: Medicaid Other | Attending: Emergency Medicine | Admitting: Emergency Medicine

## 2017-11-24 ENCOUNTER — Other Ambulatory Visit: Payer: Self-pay

## 2017-11-24 DIAGNOSIS — Z87891 Personal history of nicotine dependence: Secondary | ICD-10-CM | POA: Insufficient documentation

## 2017-11-24 DIAGNOSIS — R600 Localized edema: Secondary | ICD-10-CM | POA: Diagnosis present

## 2017-11-24 DIAGNOSIS — J45909 Unspecified asthma, uncomplicated: Secondary | ICD-10-CM | POA: Diagnosis not present

## 2017-11-24 DIAGNOSIS — L03011 Cellulitis of right finger: Secondary | ICD-10-CM | POA: Insufficient documentation

## 2017-11-24 DIAGNOSIS — Z8547 Personal history of malignant neoplasm of testis: Secondary | ICD-10-CM | POA: Insufficient documentation

## 2017-11-24 DIAGNOSIS — Z79899 Other long term (current) drug therapy: Secondary | ICD-10-CM | POA: Diagnosis not present

## 2017-11-24 MED ORDER — LIDOCAINE HCL 1 % IJ SOLN
INTRAMUSCULAR | Status: AC
  Administered 2017-11-24: 20 mL
  Filled 2017-11-24: qty 20

## 2017-11-24 MED ORDER — SULFAMETHOXAZOLE-TRIMETHOPRIM 800-160 MG PO TABS: 1.0000 | ORAL_TABLET | Freq: Two times a day (BID) | ORAL | 0 refills | Status: AC

## 2017-11-24 MED ORDER — OXYCODONE-ACETAMINOPHEN 5-325 MG PO TABS
1.0000 | ORAL_TABLET | Freq: Once | ORAL | Status: AC
  Administered 2017-11-24: 1 via ORAL
  Filled 2017-11-24: qty 1

## 2017-11-24 MED ORDER — LIDOCAINE HCL (PF) 1 % IJ SOLN: 30.0000 mL | Freq: Once | INTRAMUSCULAR | Status: DC

## 2017-11-24 NOTE — Discharge Instructions (Signed)
Soak finger in warm water and massage at least twice daily. Take [phone] mg acetaminophen (tylenol) every 8 hours for inflammation and pain.  Finish antibiotics completely.

## 2017-11-24 NOTE — ED Triage Notes (Signed)
Pt reports 4 day hx of increased swelling of nailbed of index finger of r/hand

## 2017-11-24 NOTE — ED Provider Notes (Signed)
[facility] DEPT Provider Note   CSN: [id number] Arrival date & time: 11/24/17  1235     History   Chief Complaint Chief Complaint  Patient presents with  . Abscess    HPI Robert Peck is a 32 y.o. male with h/o right testis cancer in remission s/p chemotherapy, craniotomy with resection, rigradical orchiectomy currently on remission since October 2018 here for evaluation of local pain, swelling to the nailbed of the right index finger for the last 4 to 5 days.  Symptoms gradual, worsening.  Pain is moderate to severe.  Worse with palpation.  Admits to nail biting.  Has tried to soak the finger in warm water and take Tylenol with minimal relief.  No direct trauma.  No IV drug use.  No fevers, chills.  No drainage.  No steroid or immunosuppressant drug use currently. HPI  Past Medical History:  Diagnosis Date  . Asthma     mild, no current inhaler use  . Cancer ([place])    lung   . Hernia   . Kidney stones   . Pneumonia 12/13  . Testicular mass    right    There are no active problems to display for this patient.   Past Surgical History:  Procedure Laterality Date  . BRAIN SURGERY    . HERNIA REPAIR  age 54  . ORCHIECTOMY Right 10/07/2016   Procedure: ORCHIECTOMY RIGHT RADICAL;  Surgeon: Ardis Hughs, MD;  Location: [facility];  Service: Urology;  Laterality: Right;  . surgery for undescended testicle  age 38        Home Medications    Prior to Admission medications   Medication Sig Start Date End Date Taking? Authorizing Provider  acetaminophen (TYLENOL) 500 MG tablet Take 1,000 mg by mouth every 6 (six) hours as needed for moderate pain.    [provider]  cholecalciferol (VITAMIN D) 1000 units tablet Take 1,000 Units by mouth daily.    [provider]  dexamethasone (DECADRON) 2 MG tablet Take 2 mg by mouth as directed. Take 2mg  by mouth every 6 hours x 3 days, take 2mg  by mouth every 8 hours x 3 days, take  2mg  by mouth every 12 hours x 3 days, take 2mg  once daily x 3 days. 11/07/16   [provider]  famotidine (PEPCID) 20 MG tablet Take 20 mg by mouth 2 (two) times daily.    [provider]  Lacosamide (VIMPAT) 150 MG TABS Take 150 mg by mouth 2 (two) times daily.    [provider]  Oxycodone HCl 10 MG TABS Take 10 mg by mouth every 6 (six) hours.    [provider]  senna-docusate (SENOKOT-S) 8.6-50 MG tablet Take 1 tablet by mouth 2 (two) times daily.    [provider]  sulfamethoxazole-trimethoprim (BACTRIM DS,SEPTRA DS) 800-160 MG tablet Take 1 tablet by mouth 2 (two) times daily for 7 days. 11/24/17 12/01/17  Kinnie Feil, PA-C    Family History Family History  Problem Relation Age of Onset  . Diabetes Mother     Social History Social History   Tobacco Use  . Smoking status: Former Smoker    Packs/day: 0.00    Years: 14.00    Pack years: 0.00    Types: Cigarettes    Last attempt to quit: 11/24/2016    Years since quitting: 1.0  . Smokeless tobacco: Never Used  Substance Use Topics  . Alcohol use: Yes    Comment:  occ  . Drug use: Yes    Types: Marijuana     Allergies   Patient has no known allergies.   Review of Systems Review of Systems  Musculoskeletal:       Finger tip swelling  Skin: Positive for color change.  All other systems reviewed and are negative.    Physical Exam Updated Vital Signs BP 112/86 (BP Location: Left Arm)   Pulse 82   Temp 97.9 F (36.6 C) (Oral)   Resp 17   Ht 6\' 2"  (1.88 m)   Wt 104.3 kg (230 lb)   SpO2 97%   BMI 29.53 kg/m   Physical Exam  Constitutional: He is oriented to person, place, and time. He appears well-developed and well-nourished.  Non-toxic appearance.  HENT:  Head: Normocephalic.  Right Ear: External ear normal.  Left Ear: External ear normal.  Nose: Nose normal.  Eyes: Conjunctivae and EOM are normal.  Neck: Full passive range of motion without pain.    Cardiovascular: Normal rate.  Pulmonary/Chest: Effort normal. No tachypnea. No respiratory distress.  Musculoskeletal: Normal range of motion.  Neurological: He is alert and oriented to person, place, and time.  Skin: Skin is warm and dry. Capillary refill takes less than 2 seconds. There is erythema.  Focal area of fluctuance, erythema, and yellow discoloration of skin at nail fold of right index finger. Mild edema and tenderness to corresponding finger pad.  No focal bony tenderness to DIP, full PROM of this joint without pain. No streaking or erythema up finger of hand.   Psychiatric: His behavior is normal. Thought content normal.     ED Treatments / Results  Labs (all labs ordered are listed, but only abnormal results are displayed) Labs Reviewed - No data to display  EKG None  Radiology No results found.  Procedures Procedures (including critical care time)  Medications Ordered in ED Medications  lidocaine (PF) (XYLOCAINE) 1 % injection 30 mL (has no administration in time range)  lidocaine (XYLOCAINE) 1 % (with pres) injection (20 mLs  Given 11/24/17 1441)  oxyCODONE-acetaminophen (PERCOCET/ROXICET) 5-325 MG per tablet 1 tablet (1 tablet Oral Given 11/24/17 1440)     Initial Impression / Assessment and Plan / ED Course  I have reviewed the triage vital signs and the nursing notes.  Pertinent labs & imaging results that were available during my care of the patient were reviewed by me and considered in my medical decision making (see chart for details).     32 y.o. yo male with paronychia.  Appropriate, local inflammation that slightly expands into the finger pad but no convincing signs of felon.  No joint involvement. No associated fever. I&D performed in ED with adequate evacuation of purulent discharge.  Will discharge with warm compresses, NSAIDs and I&D care instructions,   Bactrim.  History of testicular cancer status post chemo currently on remission since 2018  however he does not have any constitutional symptoms and is not on any current immunosuppressants.  Discussed risk of infection or worsening infection into the joint, streaking up the hand.  Patient aware of symptoms that would warrant return to ED for re-evaluation and treatment. Patient verbalized understanding and is agreeable with plan.   Final Clinical Impressions(s) / ED Diagnoses   Final diagnoses:  Paronychia of right index finger    ED Discharge Orders        Ordered    sulfamethoxazole-trimethoprim (BACTRIM DS,SEPTRA DS) 800-160 MG tablet  2 times daily     11/24/17  Buna, Mavis Fichera J, PA-C 11/24/17 1448    Isla Pence, MD 11/24/17 [phone]

## 2018-07-09 IMAGING — CT CT ABD-PELV W/ CM
2 of 4 series · 15 of 46 positions shown, 17 images · IV contrast (iopamidol)
Comparison: Prior CT from 10/28/2016.

CLINICAL DATA: Initial evaluation for acute left lower quadrant
tenderness, flank pain. History of testicular carcinoma.

EXAM:
CT ABDOMEN AND PELVIS WITH CONTRAST
TECHNIQUE: Multidetector CT imaging of the abdomen and pelvis was performed
using the standard protocol following bolus administration of
intravenous contrast.
CONTRAST:  100mL WCXRA0-2XX IOPAMIDOL (WCXRA0-2XX) INJECTION 61%

[Series 2: abd/pel with · axial · 0.77mm/px · z∈[-627,-227]mm · 12 of 90 slices shown, 14 images]
[im 5/90  soft-tissue]
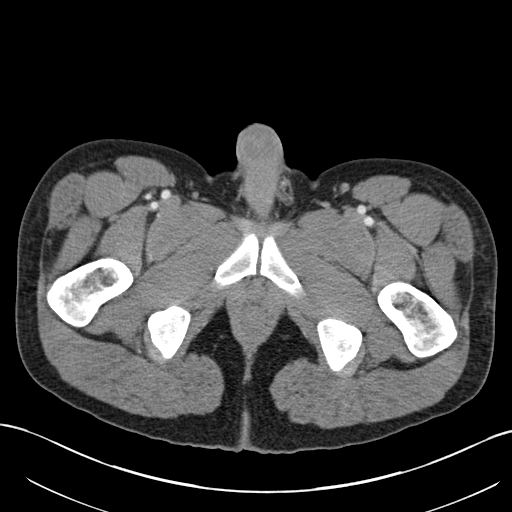
[im 5/90  bone]
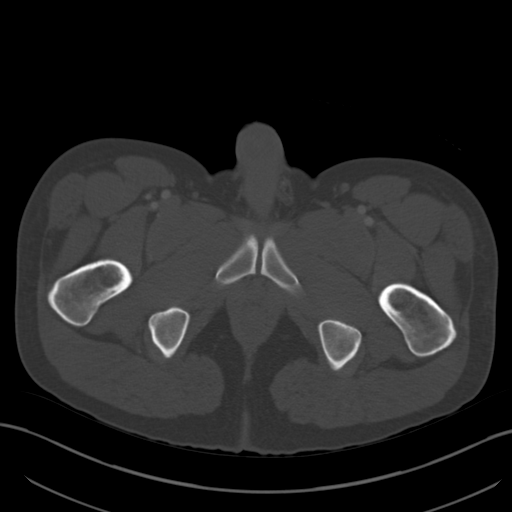
[im 15/90  soft-tissue]
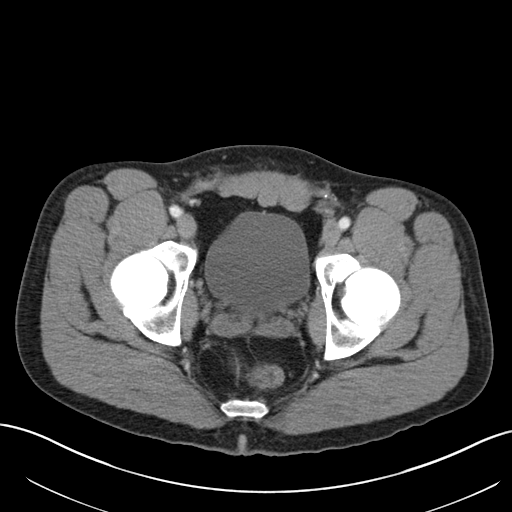
[im 19/90  soft-tissue]
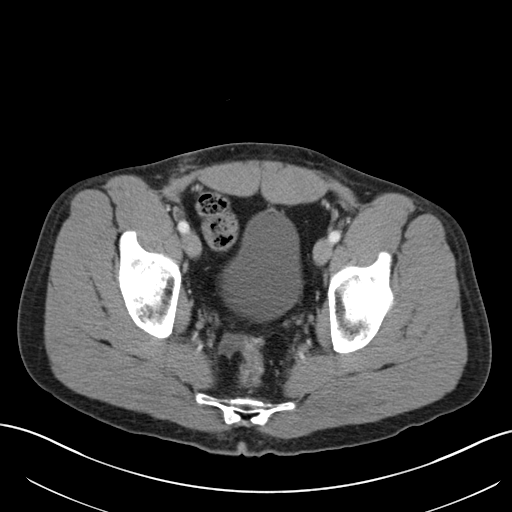
[im 29/90  soft-tissue]
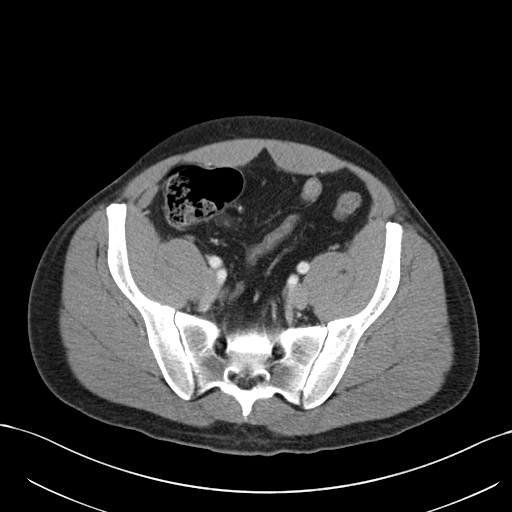
[im 33/90  soft-tissue]
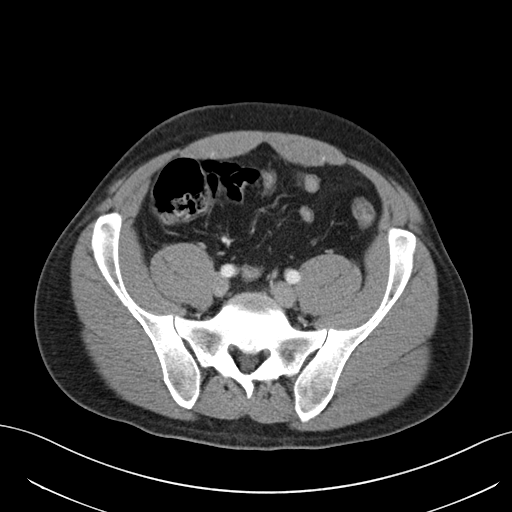
[im 43/90  soft-tissue]
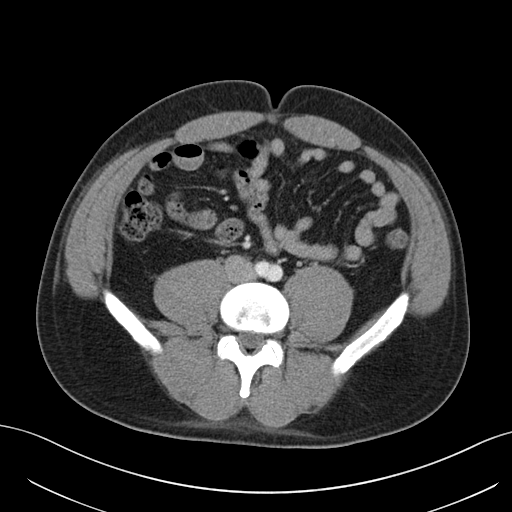
[im 47/90  soft-tissue]
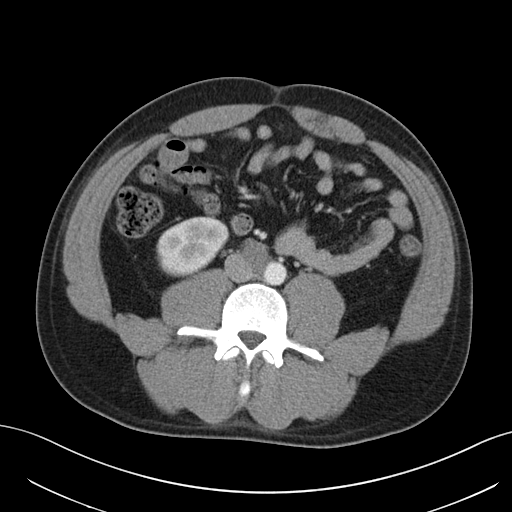
[im 57/90  soft-tissue]
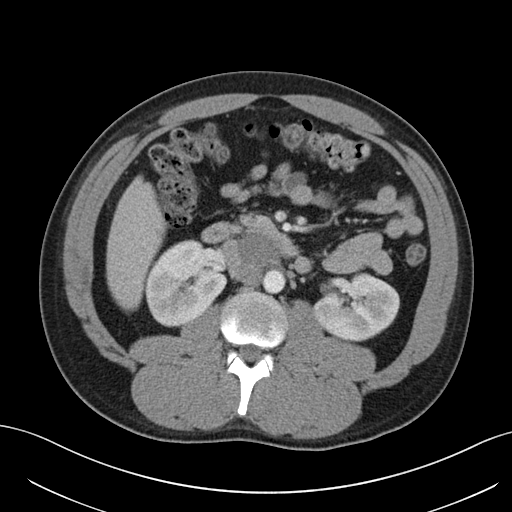
[im 61/90  soft-tissue]
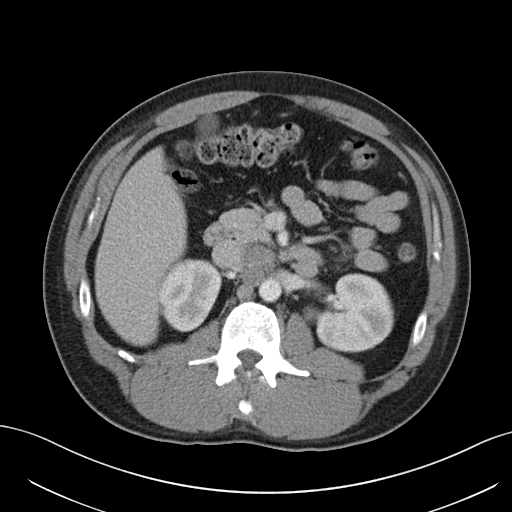
[im 61/90  bone]
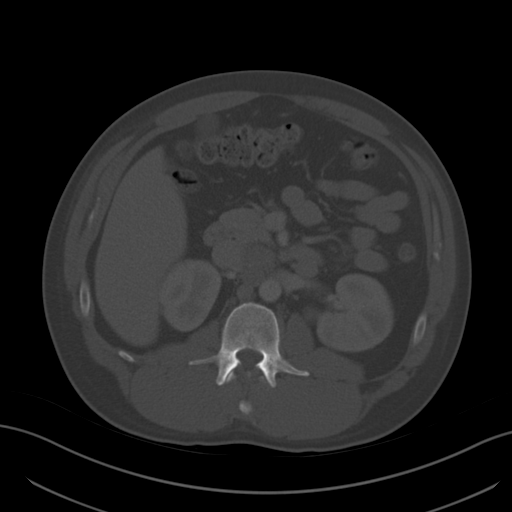
[im 71/90  soft-tissue]
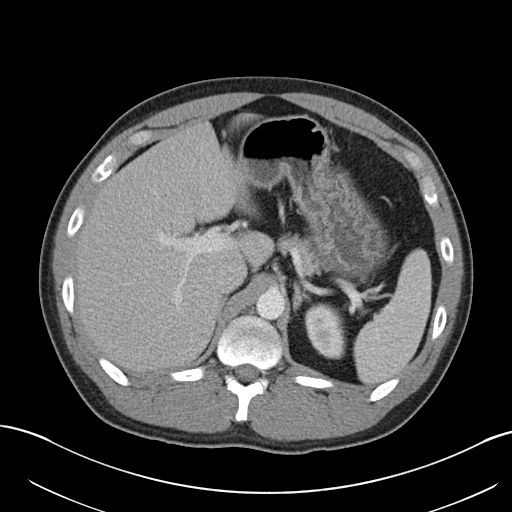
[im 75/90  soft-tissue]
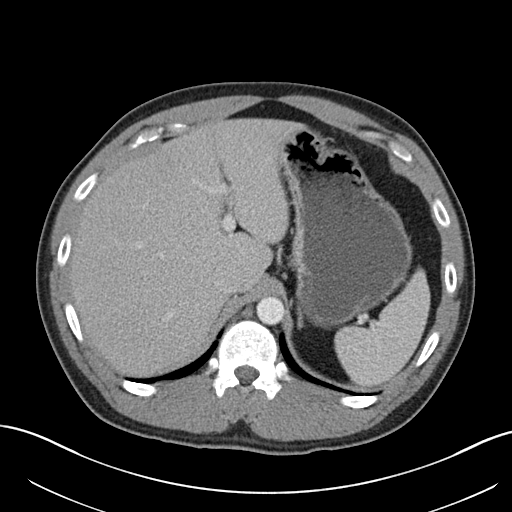
[im 85/90  soft-tissue]
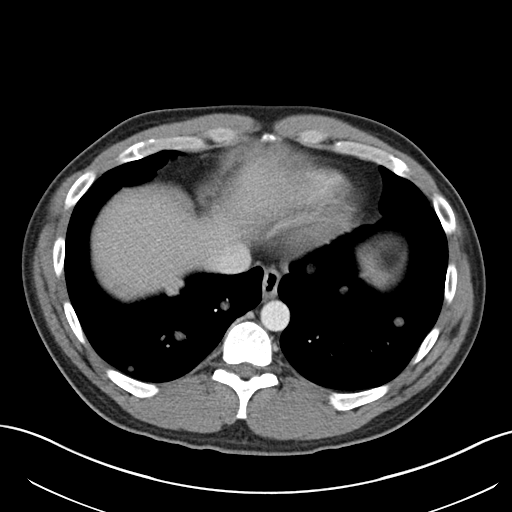

[Series 3: coronal a/|p · coronal · 0.78mm/px · 3 of 148 slices shown]
[im 50/148  soft-tissue]
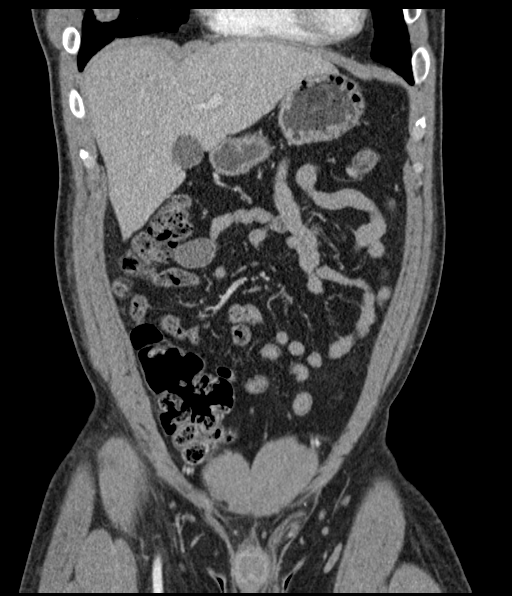
[im 66/148  soft-tissue]
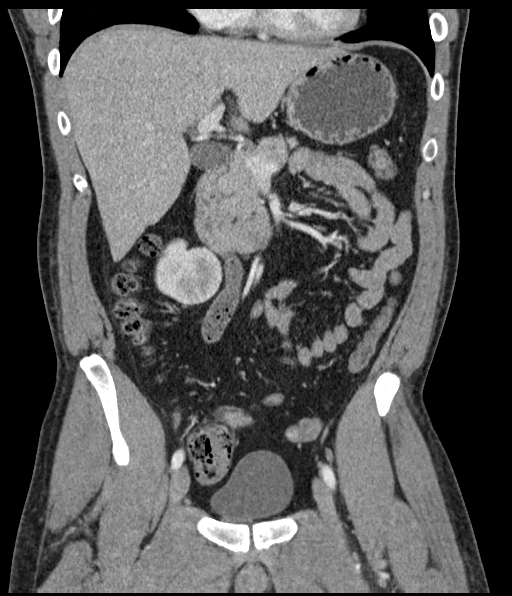
[im 82/148  soft-tissue]
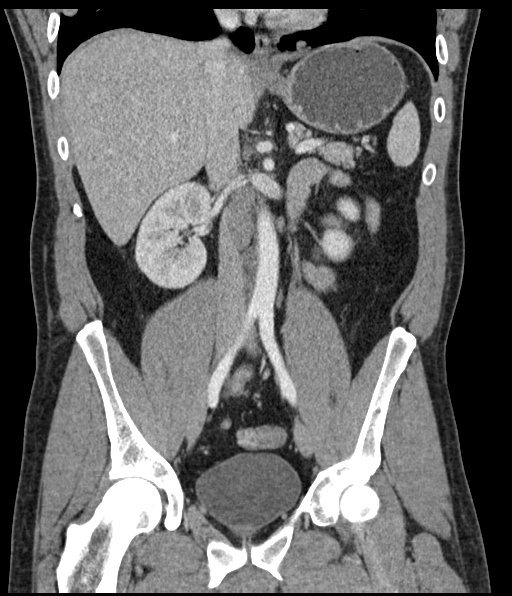

[15 of 46 positions shown; findings below may reference images not displayed]

FINDINGS: Lower chest: Multiple pulmonary nodules seen within the lung bases,
increased in size as compared to recent exam, consistent with
progressive disease. For reference purposes, largest nodule at the
right lung base measures 2.5 cm (series 6, image 9, previously
cm).

Hepatobiliary: Liver demonstrates a normal contrast enhanced
appearance. Gallbladder within normal limits. No biliary dilatation.

Pancreas: Pancreas within normal limits.

Spleen: Spleen within normal limits.

Adrenals/Urinary Tract: Adrenal glands are normal. Kidneys equal in
size with symmetric enhancement. No nephrolithiasis, hydronephrosis,
or focal enhancing renal mass. Subcentimeter hypodensity within the
interpolar left kidney noted, too small the characterize, but
statistically likely reflects a small cyst. No hydroureter. Bladder
within normal limits.

Stomach/Bowel: Stomach within normal limits. No evidence for bowel
obstruction. Appendix is normal. Descending and sigmoid colon are
largely decompressed. Mild circumferential wall thickening favored
to be related incomplete distension, although possible mild colitis
could be considered in the correct clinical setting. No other acute
inflammatory changes about the bowels.

Vascular/Lymphatic: Normal intravascular enhancement seen throughout
the intra-abdominal aorta and its branch vessels. Aortocaval nodal
conglomerate again seen, measuring similar at 3.3 x 4.1 cm (series
2, image 36), previously 3.2 x 4.2 cm). This is not significantly
changed. Additional nodal conglomerate inferiorly all slow
relatively similar measuring approximately 2.8 x 2.0 cm, not
significantly changed when measured at similar levels.

Reproductive: Prostate within normal limits.

Other: Trace free fluid within the pelvis, which may be reactive. No
free intraperitoneal air. Postsurgical changes noted within the
right groin, most likely related to prior orchiectomy.

Musculoskeletal: No acute osseus abnormality. No worrisome lytic or
blastic osseous lesions.
IMPRESSION: 1. Diffuse circumferential wall thickening about the descending and
sigmoid colon without associated inflammatory stranding, most likely
related to incomplete distension, although possible superimposed
colitis could be considered in the correct clinical setting.
2. Small volume free fluid within the pelvis, which may be reactive.
3. Increased size of multiple pulmonary nodules within the
visualized lung bases, consistent with progressive metastatic
disease.
4. No significant interval change in retroperitoneal necrotic
adenopathy, consistent with metastatic disease.

## 2018-12-25 ENCOUNTER — Encounter (HOSPITAL_COMMUNITY): Payer: Self-pay

## 2018-12-25 ENCOUNTER — Other Ambulatory Visit: Payer: Self-pay

## 2018-12-25 ENCOUNTER — Emergency Department (HOSPITAL_COMMUNITY)
Admission: EM | Admit: 2018-12-25 | Discharge: 2018-12-25 | Disposition: A | Discharge status: Home / Self Care | Payer: Medicaid Other | Attending: Emergency Medicine | Admitting: Emergency Medicine

## 2018-12-25 DIAGNOSIS — Z79899 Other long term (current) drug therapy: Secondary | ICD-10-CM | POA: Insufficient documentation

## 2018-12-25 DIAGNOSIS — U071 COVID-19: Secondary | ICD-10-CM | POA: Insufficient documentation

## 2018-12-25 DIAGNOSIS — Z20822 Contact with and (suspected) exposure to covid-19: Secondary | ICD-10-CM

## 2018-12-25 DIAGNOSIS — J45909 Unspecified asthma, uncomplicated: Secondary | ICD-10-CM | POA: Insufficient documentation

## 2018-12-25 DIAGNOSIS — R0981 Nasal congestion: Secondary | ICD-10-CM | POA: Diagnosis present

## 2018-12-25 DIAGNOSIS — Z87891 Personal history of nicotine dependence: Secondary | ICD-10-CM | POA: Insufficient documentation

## 2018-12-25 DIAGNOSIS — Z85118 Personal history of other malignant neoplasm of bronchus and lung: Secondary | ICD-10-CM | POA: Insufficient documentation

## 2018-12-25 NOTE — ED Triage Notes (Signed)
Patient c/o loss of taste and smell, nasal congestion x 3 days. Patient states the first day he had a cough, but not now.  Patient reports that his doctor wants him to have a Rapid Covid test due to history of cancer. Patient is not currently taking chemo.

## 2018-12-25 NOTE — ED Provider Notes (Signed)
[facility] DEPT Provider Note   CSN: [id number] Arrival date & time: 12/25/18  1846     History   Chief Complaint Chief Complaint  Patient presents with  . loss of taste  . loss of smell    HPI Robert Peck is a 33 y.o. male.     HPI Patient presents to the ED for covid testing.  Patient states a few days ago he started to notice some nasal congestion followed by a decrease in his sense of smell.  A day or so later he noticed that he was not tasting things.  Patient became concerned that he was developing COVID-19.  He has a history of testicular cancer and is currently in remission.  Patient also has a history of asthma but he has not been coughing and is not short of breath.  Patient states his doctor told him to come to the hospital to get a more rapid test. Past Medical History:  Diagnosis Date  . Asthma     mild, no current inhaler use  . Cancer ([place])    lung   . Hernia   . Kidney stones   . Pneumonia 12/13  . Testicular mass    right    There are no active problems to display for this patient.   Past Surgical History:  Procedure Laterality Date  . BRAIN SURGERY    . HERNIA REPAIR  age 45  . ORCHIECTOMY Right 10/07/2016   Procedure: ORCHIECTOMY RIGHT RADICAL;  Surgeon: Ardis Hughs, MD;  Location: [facility];  Service: Urology;  Laterality: Right;  . surgery for undescended testicle  age 78        Home Medications    Prior to Admission medications   Medication Sig Start Date End Date Taking? Authorizing Provider  acetaminophen (TYLENOL) 500 MG tablet Take 1,000 mg by mouth every 6 (six) hours as needed for moderate pain.    [provider]  cholecalciferol (VITAMIN D) 1000 units tablet Take 1,000 Units by mouth daily.    [provider]  dexamethasone (DECADRON) 2 MG tablet Take 2 mg by mouth as directed. Take 2mg  by mouth every 6 hours x 3 days, take 2mg  by mouth every 8 hours x 3 days, take 2mg   by mouth every 12 hours x 3 days, take 2mg  once daily x 3 days. 11/07/16   [provider]  famotidine (PEPCID) 20 MG tablet Take 20 mg by mouth 2 (two) times daily.    [provider]  Lacosamide (VIMPAT) 150 MG TABS Take 150 mg by mouth 2 (two) times daily.    [provider]  Oxycodone HCl 10 MG TABS Take 10 mg by mouth every 6 (six) hours.    [provider]  senna-docusate (SENOKOT-S) 8.6-50 MG tablet Take 1 tablet by mouth 2 (two) times daily.    [provider]    Family History Family History  Problem Relation Age of Onset  . Diabetes Mother     Social History Social History   Tobacco Use  . Smoking status: Former Smoker    Packs/day: 0.00    Years: 14.00    Pack years: 0.00    Types: Cigarettes    Quit date: 11/24/2016    Years since quitting: 2.0  . Smokeless tobacco: Never Used  Substance Use Topics  . Alcohol use: Yes    Comment: occ  . Drug use: Yes    Types: Marijuana  Allergies   Patient has no known allergies.   Review of Systems Review of Systems  All other systems reviewed and are negative.    Physical Exam Updated Vital Signs BP 127/88 (BP Location: Left Arm)   Pulse 72   Temp 98.3 F (36.8 C) (Oral)   Resp 18   Ht 1.88 m (6\' 2" )   Wt 99.8 kg   SpO2 100%   BMI 28.25 kg/m   Physical Exam Vitals signs and nursing note reviewed.  Constitutional:      General: He is not in acute distress.    Appearance: He is well-developed.  HENT:     Head: Normocephalic and atraumatic.     Right Ear: External ear normal.     Left Ear: External ear normal.  Eyes:     General: No scleral icterus.       Right eye: No discharge.        Left eye: No discharge.     Conjunctiva/sclera: Conjunctivae normal.  Neck:     Musculoskeletal: Neck supple.     Trachea: No tracheal deviation.  Cardiovascular:     Rate and Rhythm: Normal rate and regular rhythm.  Pulmonary:     Effort: Pulmonary effort is normal. No  respiratory distress.     Breath sounds: Normal breath sounds. No stridor. No wheezing or rales.  Abdominal:     General: Bowel sounds are normal. There is no distension.     Palpations: Abdomen is soft.     Tenderness: There is no abdominal tenderness. There is no guarding or rebound.  Musculoskeletal:        General: No tenderness.  Skin:    General: Skin is warm and dry.     Findings: No rash.  Neurological:     Mental Status: He is alert.     Cranial Nerves: No cranial nerve deficit (no facial droop, extraocular movements intact, no slurred speech).     Sensory: No sensory deficit.     Motor: No abnormal muscle tone or seizure activity.     Coordination: Coordination normal.      ED Treatments / Results  Labs (all labs ordered are listed, but only abnormal results are displayed) Labs Reviewed  SARS CORONAVIRUS 2 (TAT 6-24 HRS)    EKG None  Radiology No results found.  Procedures Procedures (including critical care time)  Medications Ordered in ED Medications - No data to display   Initial Impression / Assessment and Plan / ED Course  I have reviewed the triage vital signs and the nursing notes.  Pertinent labs & imaging results that were available during my care of the patient were reviewed by me and considered in my medical decision making (see chart for details).   Patient's exam is reassuring.  He is breathing easily.  His oxygen saturation is 100%.  Patient is in remission and is not currently on any chemotherapy treatments.  Patient overall feels well.  I am concerned however that he does have symptoms of possible COVID-19.  As requested we will send off a COVID-19 test for analysis but he appears stable for outpatient management.  Robert Peck was evaluated in Emergency Department on 12/25/2018 for the symptoms described in the history of present illness. He was evaluated in the context of the global COVID-19 pandemic, which necessitated consideration  that the patient might be at risk for infection with the SARS-CoV-2 virus that causes COVID-19. Institutional protocols and algorithms that pertain to the evaluation of  patients at risk for COVID-19 are in a state of rapid change based on information released by regulatory bodies including the CDC and federal and state organizations. These policies and algorithms were followed during the patient's care in the ED.  Final Clinical Impressions(s) / ED Diagnoses   Final diagnoses:  Suspected Covid-19 Virus Infection    ED Discharge Orders    None       Dorie Rank, MD 12/25/18 2122

## 2018-12-26 LAB — SARS CORONAVIRUS 2 (TAT 6-24 HRS): SARS Coronavirus 2: POSITIVE — AB

## 2018-12-27 ENCOUNTER — Telehealth: Payer: Self-pay

## 2018-12-27 NOTE — Telephone Encounter (Signed)
Pt. Calling to report he saw his COVID 19 test results on My Chart. Verbalizes understanding. Will continue to quarantine x 10 days from beginning of symptoms. Reports he is a cancer pt. And is in remission. Will contact his PCP. Reviewed home safety points - isolate from family, good hand washing, cleaning hard surfaces.

## 2022-03-17 ENCOUNTER — Encounter (HOSPITAL_COMMUNITY): Payer: Self-pay

## 2022-03-17 ENCOUNTER — Other Ambulatory Visit: Payer: Self-pay

## 2022-03-17 ENCOUNTER — Emergency Department (HOSPITAL_COMMUNITY): Payer: Medicaid Other

## 2022-03-17 ENCOUNTER — Emergency Department (HOSPITAL_COMMUNITY)
Admission: EM | Admit: 2022-03-17 | Discharge: 2022-03-17 | Disposition: A | Discharge status: Home / Self Care | Payer: Medicaid Other | Attending: Emergency Medicine | Admitting: Emergency Medicine

## 2022-03-17 DIAGNOSIS — M549 Dorsalgia, unspecified: Secondary | ICD-10-CM | POA: Diagnosis not present

## 2022-03-17 DIAGNOSIS — Z5321 Procedure and treatment not carried out due to patient leaving prior to being seen by health care provider: Secondary | ICD-10-CM | POA: Insufficient documentation

## 2022-03-17 DIAGNOSIS — R1031 Right lower quadrant pain: Secondary | ICD-10-CM | POA: Insufficient documentation

## 2022-03-17 LAB — URINALYSIS, ROUTINE W REFLEX MICROSCOPIC
Bilirubin Urine: NEGATIVE
Glucose, UA: NEGATIVE mg/dL
Ketones, ur: NEGATIVE mg/dL
Leukocytes,Ua: NEGATIVE
Nitrite: NEGATIVE
Protein, ur: 30 mg/dL — AB
RBC / HPF: >50 RBC/hpf — ABNORMAL HIGH (ref 0–5)
Specific Gravity, Urine: 1.021 (ref 1.005–1.030)
pH: 5 (ref 5.0–8.0)

## 2022-03-17 LAB — COMPREHENSIVE METABOLIC PANEL
ALT: 42 U/L (ref 0–44)
AST: 27 U/L (ref 15–41)
Albumin: 4.3 g/dL (ref 3.5–5.0)
Alkaline Phosphatase: 55 U/L (ref 38–126)
Anion gap: 7 (ref 5–15)
BUN: 15 mg/dL (ref 6–20)
CO2: 27 mmol/L (ref 22–32)
Calcium: 9 mg/dL (ref 8.9–10.3)
Chloride: 102 mmol/L (ref 98–111)
Creatinine, Ser: 1.27 mg/dL — ABNORMAL HIGH (ref 0.61–1.24)
GFR, Estimated: >60 mL/min (ref >60)
Glucose, Bld: 119 mg/dL — ABNORMAL HIGH (ref 70–99)
Potassium: 3.8 mmol/L (ref 3.5–5.1)
Sodium: 136 mmol/L (ref 135–145)
Total Bilirubin: 0.5 mg/dL (ref 0.3–1.2)
Total Protein: 7.2 g/dL (ref 6.5–8.1)

## 2022-03-17 LAB — CBC
HCT: 42.8 % (ref 39.0–52.0)
Hemoglobin: 14.8 g/dL (ref 13.0–17.0)
MCH: 32.5 pg (ref 26.0–34.0)
MCHC: 34.6 g/dL (ref 30.0–36.0)
MCV: 93.9 fL (ref 80.0–100.0)
Platelets: 159 10*3/uL (ref 150–400)
RBC: 4.56 MIL/uL (ref 4.22–5.81)
RDW: 12 % (ref 11.5–15.5)
WBC: 6.7 10*3/uL (ref 4.0–10.5)
nRBC: 0 % (ref 0.0–0.2)

## 2022-03-17 LAB — LIPASE, BLOOD: Lipase: 33 U/L (ref 11–51)

## 2022-03-17 MED ORDER — SODIUM CHLORIDE (PF) 0.9 % IJ SOLN
INTRAMUSCULAR | Status: AC
  Filled 2022-03-17: qty 50

## 2022-03-17 MED ORDER — IOHEXOL 300 MG/ML  SOLN: 100.0000 mL | Freq: Once | INTRAMUSCULAR | Status: DC | PRN

## 2022-03-17 NOTE — ED Provider Triage Note (Signed)
Emergency Medicine Provider Triage Evaluation Note  Robert Peck , a 36 y.o. male  was evaluated in triage.  Pt complains of abdominal pain and right flank pain   Review of Systems  Positive: Pain in back and abdomen.   Negative: fever  Physical Exam  BP (!) 146/100   Pulse 68   Temp 97.8 F (36.6 C) (Oral)   Resp 18   Ht '6\' 2"'$  (1.88 m)   Wt 99 kg   SpO2 95%   BMI 28.02 kg/m  Gen:   Awake, no distress   Resp:  Normal effort  MSK:   Moves extremities without difficulty  Other:  Tender right flank   Medical Decision Making  Medically screening exam initiated at 10:25 AM.  Appropriate orders placed.  JAYKO VOORHEES was informed that the remainder of the evaluation will be completed by another provider, this initial triage assessment does not replace that evaluation, and the importance of remaining in the ED until their evaluation is complete.     Fransico Meadow, [place] 03/17/22 1026

## 2022-03-17 NOTE — ED Triage Notes (Signed)
C/o abd pain described as stabbing radiating to right side since this am.  Hx testicular cancer.  No guarding or rigidity noted.  Denies n/v/d
